# Patient Record
Sex: Female | Born: 1945
Health system: Southern US, Community
[De-identification: ages and names within clinical notes are randomized; demographics above are authoritative.]

## PROBLEM LIST (undated history)

## (undated) DIAGNOSIS — E2839 Other primary ovarian failure: Secondary | ICD-10-CM

## (undated) DIAGNOSIS — H539 Unspecified visual disturbance: Secondary | ICD-10-CM

## (undated) DIAGNOSIS — E039 Hypothyroidism, unspecified: Secondary | ICD-10-CM

## (undated) DIAGNOSIS — R569 Unspecified convulsions: Secondary | ICD-10-CM

## (undated) DIAGNOSIS — G40909 Epilepsy, unspecified, not intractable, without status epilepticus: Secondary | ICD-10-CM

## (undated) DIAGNOSIS — I10 Essential (primary) hypertension: Secondary | ICD-10-CM

## (undated) HISTORY — PX: TOE SURGERY: SHX1073

## (undated) HISTORY — PX: THYROID SURGERY: SHX805

## (undated) HISTORY — DX: Unspecified convulsions: R56.9

## (undated) HISTORY — DX: Epilepsy, unspecified, not intractable, without status epilepticus: G40.909

## (undated) HISTORY — DX: Unspecified visual disturbance: H53.9

## (undated) HISTORY — PX: ABDOMINAL HYSTERECTOMY: SHX81

## (undated) HISTORY — PX: TONSILLECTOMY: SUR1361

## (undated) HISTORY — PX: CYSTECTOMY: SUR359

---

## 1968-01-18 HISTORY — PX: BREAST CYST EXCISION: SHX579

## 2011-07-14 ENCOUNTER — Other Ambulatory Visit (HOSPITAL_COMMUNITY): Payer: Self-pay | Admitting: Family Medicine

## 2011-07-14 ENCOUNTER — Other Ambulatory Visit (INDEPENDENT_AMBULATORY_CARE_PROVIDER_SITE_OTHER): Payer: Self-pay | Admitting: General Surgery

## 2011-07-14 DIAGNOSIS — E010 Iodine-deficiency related diffuse (endemic) goiter: Secondary | ICD-10-CM

## 2011-07-19 ENCOUNTER — Ambulatory Visit (HOSPITAL_COMMUNITY)
Admission: RE | Admit: 2011-07-19 | Discharge: 2011-07-19 | Disposition: A | Discharge status: Home / Self Care | Payer: Self-pay | Source: Ambulatory Visit | Attending: Family Medicine | Admitting: Family Medicine

## 2011-07-19 DIAGNOSIS — E049 Nontoxic goiter, unspecified: Secondary | ICD-10-CM | POA: Insufficient documentation

## 2011-07-19 DIAGNOSIS — E010 Iodine-deficiency related diffuse (endemic) goiter: Secondary | ICD-10-CM

## 2011-10-03 ENCOUNTER — Emergency Department (HOSPITAL_COMMUNITY): Payer: Self-pay

## 2011-10-03 ENCOUNTER — Emergency Department (HOSPITAL_COMMUNITY)
Admission: EM | Admit: 2011-10-03 | Discharge: 2011-10-03 | Disposition: A | Discharge status: Home / Self Care | Payer: Self-pay | Attending: Emergency Medicine | Admitting: Emergency Medicine

## 2011-10-03 ENCOUNTER — Encounter (HOSPITAL_COMMUNITY): Payer: Self-pay

## 2011-10-03 DIAGNOSIS — Z7982 Long term (current) use of aspirin: Secondary | ICD-10-CM | POA: Insufficient documentation

## 2011-10-03 DIAGNOSIS — Z79899 Other long term (current) drug therapy: Secondary | ICD-10-CM | POA: Insufficient documentation

## 2011-10-03 DIAGNOSIS — E876 Hypokalemia: Secondary | ICD-10-CM | POA: Insufficient documentation

## 2011-10-03 DIAGNOSIS — Z87891 Personal history of nicotine dependence: Secondary | ICD-10-CM | POA: Insufficient documentation

## 2011-10-03 DIAGNOSIS — R569 Unspecified convulsions: Secondary | ICD-10-CM | POA: Insufficient documentation

## 2011-10-03 DIAGNOSIS — I10 Essential (primary) hypertension: Secondary | ICD-10-CM | POA: Insufficient documentation

## 2011-10-03 DIAGNOSIS — Z823 Family history of stroke: Secondary | ICD-10-CM | POA: Insufficient documentation

## 2011-10-03 HISTORY — DX: Hypothyroidism, unspecified: E03.9

## 2011-10-03 HISTORY — DX: Essential (primary) hypertension: I10

## 2011-10-03 HISTORY — DX: Other primary ovarian failure: E28.39

## 2011-10-03 LAB — CBC WITH DIFFERENTIAL/PLATELET
Basophils Absolute: 0 10*3/uL (ref 0.0–0.1)
Basophils Relative: 0 % (ref 0–1)
Eosinophils Relative: 2 % (ref 0–5)
Lymphocytes Relative: 41 % (ref 12–46)
MCHC: 34.7 g/dL (ref 30.0–36.0)
MCV: 79.1 fL (ref 78.0–100.0)
Platelets: 293 10*3/uL (ref 150–400)
RDW: 14.5 % (ref 11.5–15.5)
WBC: 11.4 10*3/uL — ABNORMAL HIGH (ref 4.0–10.5)

## 2011-10-03 LAB — COMPREHENSIVE METABOLIC PANEL
ALT: 9 U/L (ref 0–35)
AST: 17 U/L (ref 0–37)
Albumin: 4.1 g/dL (ref 3.5–5.2)
CO2: 27 mEq/L (ref 19–32)
Calcium: 9.8 mg/dL (ref 8.4–10.5)
Creatinine, Ser: 0.86 mg/dL (ref 0.50–1.10)
GFR calc non Af Amer: 70 mL/min — ABNORMAL LOW (ref >90)
Sodium: 139 mEq/L (ref 135–145)
Total Protein: 7.7 g/dL (ref 6.0–8.3)

## 2011-10-03 LAB — TROPONIN I: Troponin I: <0.3 ng/mL (ref <0.30)

## 2011-10-03 LAB — PROTIME-INR
INR: 1.01 (ref 0.00–1.49)
Prothrombin Time: 13.5 seconds (ref 11.6–15.2)

## 2011-10-03 MED ORDER — POTASSIUM CHLORIDE CRYS ER 20 MEQ PO TBCR: 20.0000 meq | EXTENDED_RELEASE_TABLET | Freq: Two times a day (BID) | ORAL | Status: DC

## 2011-10-03 NOTE — ED Notes (Signed)
The patient was found by her sister having what was described as a probable seizure.  The patient denies a history of seizures, however she was postictal when EMS arrived, and she bit her tongue.  The patient present incontinent of urine.  She denies any head, neck, or back pain.

## 2011-10-03 NOTE — ED Notes (Signed)
Patient transported to CT 

## 2011-10-03 NOTE — ED Provider Notes (Signed)
History     CSN: [id number]  Arrival date & time 10/03/11  0230   First MD Initiated Contact with Patient 10/03/11 0251      Chief Complaint  Patient presents with  . Seizures    not witnessed; no prior history    (Consider location/radiation/quality/duration/timing/severity/associated sxs/prior treatment) HPI Comments: 66 y/o with hx of change in MS this evening.  The patient states that the last thing she remembers she went to sleep at approximately 10:00 PM, the next thing she remembers she was waking up on the floor looking up at a much of people standing around her period her friend had called the paramedics because the patient was found face down with blood on the floor in urine on the floor. This was beside the bed. There was no witnessed seizure activity but the patient was confused significantly on the paramedics arrival.  By the time the patient was in the ambulance her mental status was back to baseline. At this time the patient is able to me that she has no other complaints other than a mild intermittent headache over the last several months and the occasional spell of lightheadedness. There has been no weakness numbness ataxia blurred vision chest pain cough shortness of breath swelling rashes diarrhea dysuria or any other complaints.  Patient is a 66 y.o. female presenting with seizures. The history is provided by the patient and a friend.  Seizures     Past Medical History  Diagnosis Date  . Hypothyroid   . Hypertension   . Estrogen deficiency     Past Surgical History  Procedure Date  . Abdominal hysterectomy   . Tonsillectomy   . Thyroid surgery   . Cystectomy     bilateral breast  . Toe surgery     Family History  Problem Relation Age of Onset  . Osteoarthritis Mother   . Hypertension Mother   . Hypertension Father   . Stroke Father     History  Substance Use Topics  . Smoking status: Former Smoker -- 1.0 packs/day    Types: Cigarettes    Quit  date: 07/02/2004  . Smokeless tobacco: Not on file  . Alcohol Use: 0.0 oz/week    0 Glasses of wine per week    OB History    Grav Para Term Preterm Abortions TAB SAB Ect Mult Living   2 1 1  1            Review of Systems  Neurological: Positive for seizures.  All other systems reviewed and are negative.    Allergies  Review of patient's allergies indicates no known allergies.  Home Medications   Current Outpatient Rx  Name Route Sig Dispense Refill  . ASPIRIN EC 81 MG PO TBEC Oral Take 81 mg by mouth daily.    Marland Kitchen PREMARIN PO Oral Take 1 tablet by mouth daily.    Marland Kitchen SYNTHROID PO Oral Take 1 tablet by mouth daily.    Marland Kitchen PRESCRIPTION MEDICATION Oral Take 1 tablet by mouth daily. Blood pressure medication    . POTASSIUM CHLORIDE CRYS ER 20 MEQ PO TBCR Oral Take 1 tablet (20 mEq total) by mouth 2 (two) times daily. 10 tablet 0    BP 151/71  Pulse 106  Temp 98.6 F (37 C) (Oral)  Resp 18  Ht 5\' 4"  (1.626 m)  Wt 151 lb (68.493 kg)  BMI 25.92 kg/m2  SpO2 99%  Physical Exam  Nursing note and vitals reviewed. Constitutional: She appears well-developed  and well-nourished. No distress.  HENT:  Head: Normocephalic and atraumatic.  Mouth/Throat: Oropharynx is clear and moist. No oropharyngeal exudate.       Minimal evidence of tongue biting at the tip of the tongue.  Eyes: Conjunctivae normal and EOM are normal. Pupils are equal, round, and reactive to light. Right eye exhibits no discharge. Left eye exhibits no discharge. No scleral icterus.  Neck: Normal range of motion. Neck supple. No JVD present. No thyromegaly present.  Cardiovascular: Normal rate, regular rhythm, normal heart sounds and intact distal pulses.  Exam reveals no gallop and no friction rub.   No murmur heard. Pulmonary/Chest: Effort normal and breath sounds normal. No respiratory distress. She has no wheezes. She has no rales.  Abdominal: Soft. Bowel sounds are normal. She exhibits no distension and no mass.  There is no tenderness.  Musculoskeletal: Normal range of motion. She exhibits no edema and no tenderness.  Lymphadenopathy:    She has no cervical adenopathy.  Neurological: She is alert. Coordination normal.       Neurologic exam:  Speech clear, pupils equal round reactive to light, extraocular movements intact  Normal peripheral visual fields Cranial nerves III through XII normal including no facial droop Follows commands, moves all extremities x4, normal strength to bilateral upper and lower extremities at all major muscle groups including grip Sensation normal to light touch and pinprick Coordination intact, no limb ataxia, finger-nose-finger normal Rapid alternating movements normal No pronator drift Gait normal   Skin: Skin is warm and dry. No rash noted. No erythema.  Psychiatric: She has a normal mood and affect. Her behavior is normal.    ED Course  Procedures (including critical care time)  Labs Reviewed  CBC WITH DIFFERENTIAL - Abnormal; Notable for the following:    WBC 11.4 (*)     Hemoglobin 11.0 (*)     HCT 31.7 (*)     Lymphs Abs 4.7 (*)     All other components within normal limits  COMPREHENSIVE METABOLIC PANEL - Abnormal; Notable for the following:    Potassium 3.4 (*)     Glucose, Bld 102 (*)     Total Bilirubin 0.2 (*)     GFR calc non Af Amer 70 (*)     GFR calc Af Amer 81 (*)     All other components within normal limits  APTT  PROTIME-INR  TROPONIN I   Ct Head Wo Contrast  10/03/2011  *RADIOLOGY REPORT*  Clinical Data: Possible seizure.  CT HEAD WITHOUT CONTRAST  Technique:  Contiguous axial images were obtained from the base of the skull through the vertex without contrast.  Comparison: None.  Findings: No evidence of acute intracranial abnormality including infarction, hemorrhage, mass lesion, mass effect, midline shift or abnormal extra-axial fluid collection is identified.  Mild appearing chronic microvascular ischemic change is noted.  Imaged  paranasal sinuses and mastoid air cells are clear.  IMPRESSION: No acute finding.   Original Report Authenticated By: Bernadene Bell. D'ALESSIO, M.D.      1. Seizure   2. Hypokalemia       MDM  At this time the patient has a normal mental status and normal neurologic exam, she does have evidence of seizure-like activity such as the urine incontinence on the floor at her home as well as tongue biting. Her postictal phase is also consistent with this. Her EKG shows nonspecific ST abnormalities, she has no old EKG with which to compare. She has no history of heart disease,  she is not a drinker and does not drink alcohol but occasionally. She has not started any new medications area CT scan pending, patient stable and without complaint at this time  ED ECG REPORT  I personally interpreted this EKG   Date: 10/03/2011   Rate: 104  Rhythm: sinus tachycardia  QRS Axis: normal  Intervals: normal  ST/T Wave abnormalities: nonspecific ST/T changes  Conduction Disutrbances:none  Narrative Interpretation:   Old EKG Reviewed: No old EKG with which to compare, diffuse ST segment abnormalities   The patient has had no further seizures, her vital signs have normalized, her CT scan has been reviewed and shows no signs of acute abnormalities, slight hypokalemia but no other significant findings. The patient appears stable for discharge, she has been encouraged to followup with neurology for EEG, I've given her precautions for seizures including no driving swimming or operating heavy machinery. She is expressed her understanding. At this time given this is a first-time seizure we'll not start any new medications for her       Vida Roller, MD 10/03/11 [phone]

## 2011-10-03 NOTE — ED Notes (Signed)
The patient is AOx4 and comfortable with her discharge instructions. 

## 2011-11-24 ENCOUNTER — Emergency Department (HOSPITAL_COMMUNITY)
Admission: EM | Admit: 2011-11-24 | Discharge: 2011-11-24 | Disposition: A | Discharge status: Home / Self Care | Payer: Self-pay | Attending: Emergency Medicine | Admitting: Emergency Medicine

## 2011-11-24 ENCOUNTER — Encounter (HOSPITAL_COMMUNITY): Payer: Self-pay | Admitting: Emergency Medicine

## 2011-11-24 DIAGNOSIS — Z7982 Long term (current) use of aspirin: Secondary | ICD-10-CM | POA: Insufficient documentation

## 2011-11-24 DIAGNOSIS — I10 Essential (primary) hypertension: Secondary | ICD-10-CM | POA: Insufficient documentation

## 2011-11-24 DIAGNOSIS — Z79899 Other long term (current) drug therapy: Secondary | ICD-10-CM | POA: Insufficient documentation

## 2011-11-24 DIAGNOSIS — R569 Unspecified convulsions: Secondary | ICD-10-CM | POA: Insufficient documentation

## 2011-11-24 DIAGNOSIS — E039 Hypothyroidism, unspecified: Secondary | ICD-10-CM | POA: Insufficient documentation

## 2011-11-24 DIAGNOSIS — Z87891 Personal history of nicotine dependence: Secondary | ICD-10-CM | POA: Insufficient documentation

## 2011-11-24 LAB — GLUCOSE, CAPILLARY: Glucose-Capillary: 103 mg/dL — ABNORMAL HIGH (ref 70–99)

## 2011-11-24 MED ORDER — LEVETIRACETAM 500 MG PO TABS: 500.0000 mg | ORAL_TABLET | Freq: Two times a day (BID) | ORAL | Status: DC

## 2011-11-24 MED ORDER — SODIUM CHLORIDE 0.9 % IV SOLN
500.0000 mg | Freq: Once | INTRAVENOUS | Status: AC
  Administered 2011-11-24: 500 mg via INTRAVENOUS
  Filled 2011-11-24: qty 5

## 2011-11-24 MED ORDER — LORAZEPAM 2 MG/ML IJ SOLN
1.0000 mg | Freq: Once | INTRAMUSCULAR | Status: AC
  Administered 2011-11-24: 1 mg via INTRAVENOUS
  Filled 2011-11-24: qty 1

## 2011-11-24 NOTE — ED Notes (Signed)
Pt transported from home by EMS after being found by family with snoring respirations. Pt does have tongue trauma and incontinence. A & O on arrival to ED. Unable to est IV. Pt has had 1 seizure in September of this year. No medications for seizures.

## 2011-11-24 NOTE — ED Provider Notes (Signed)
History     CSN: [id number]  Arrival date & time 11/24/11  0145   First MD Initiated Contact with Patient 11/24/11 0150      Chief Complaint  Patient presents with  . Seizures    (Consider location/radiation/quality/duration/timing/severity/associated sxs/prior treatment) HPI Comments: 66 year old female with a history of first time seizure in September of this year who presents with a complaint of recurrent seizure. The patient states that about a week and a half ago she felt like she may have had another seizure after she bit her tongue in her sleep and then again this evening she had another likely seizure after she was found by family to have snoring respirations and was difficult to arouse. She had incontinence as well. When paramedics found her she was postictal, she has had a rapid return to her baseline mental status. She denies feeling bad at all this evening, she has no complaints, at this time she has no complaints other than some mild discomfort in her tongue which is no different than it was before her seizure this evening. She denies chest pain, shortness of breath, blurred vision, numbness, weakness, ataxia, confusion, memory loss. She does number having seizures this evening. She was seen by neurology after my initial evaluation of her in September and had an EEG which she reports was normal. She was not started on any medications.  Patient is a 66 y.o. female presenting with seizures. The history is provided by the patient.  Seizures     Past Medical History  Diagnosis Date  . Hypothyroid   . Hypertension   . Estrogen deficiency     Past Surgical History  Procedure Date  . Abdominal hysterectomy   . Tonsillectomy   . Thyroid surgery   . Cystectomy     bilateral breast  . Toe surgery     Family History  Problem Relation Age of Onset  . Osteoarthritis Mother   . Hypertension Mother   . Hypertension Father   . Stroke Father     History  Substance Use  Topics  . Smoking status: Former Smoker -- 1.0 packs/day    Types: Cigarettes    Quit date: 07/02/2004  . Smokeless tobacco: Not on file  . Alcohol Use: 0.0 oz/week    0 Glasses of wine per week    OB History    Grav Para Term Preterm Abortions TAB SAB Ect Mult Living   2 1 1  1            Review of Systems  Neurological: Positive for seizures.  All other systems reviewed and are negative.    Allergies  Review of patient's allergies indicates no known allergies.  Home Medications   Current Outpatient Rx  Name  Route  Sig  Dispense  Refill  . ASPIRIN EC 81 MG PO TBEC   Oral   Take 81 mg by mouth daily.         Marland Kitchen ESTRADIOL 0.5 MG PO TABS   Oral   Take 0.5 mg by mouth daily.         Marland Kitchen LEVOTHYROXINE SODIUM 50 MCG PO TABS   Oral   Take 50 mcg by mouth daily.         Marland Kitchen LOSARTAN POTASSIUM-HCTZ 50-12.5 MG PO TABS   Oral   Take 1 tablet by mouth daily.         Marland Kitchen LEVETIRACETAM 500 MG PO TABS   Oral   Take 1 tablet (500 mg total)  by mouth every 12 (twelve) hours.   60 tablet   3     BP 124/72  Pulse 105  Temp 98.4 F (36.9 C) (Oral)  Resp 20  SpO2 100%  Physical Exam  Nursing note and vitals reviewed. Constitutional: She appears well-developed and well-nourished. No distress.  HENT:  Head: Normocephalic and atraumatic.  Mouth/Throat: Oropharynx is clear and moist. No oropharyngeal exudate.       Small bite mark to the tongue, no bleeding  Eyes: Conjunctivae normal and EOM are normal. Pupils are equal, round, and reactive to light. Right eye exhibits no discharge. Left eye exhibits no discharge. No scleral icterus.  Neck: Normal range of motion. Neck supple. No JVD present. No thyromegaly present.  Cardiovascular: Normal rate, regular rhythm, normal heart sounds and intact distal pulses.  Exam reveals no gallop and no friction rub.   No murmur heard. Pulmonary/Chest: Effort normal and breath sounds normal. No respiratory distress. She has no wheezes.  She has no rales.  Abdominal: Soft. Bowel sounds are normal. She exhibits no distension and no mass. There is no tenderness.  Musculoskeletal: Normal range of motion. She exhibits no edema and no tenderness.  Lymphadenopathy:    She has no cervical adenopathy.  Neurological: She is alert. Coordination normal.       Speech is clear, vision is at baseline, cranial nerves III through XII are intact, strength is 5 out of 5 in all 4 extremities, sensation to light touch equal bilateral lower extremities, no limb ataxia  Skin: Skin is warm and dry. No rash noted. No erythema.  Psychiatric: She has a normal mood and affect. Her behavior is normal.    ED Course  Procedures (including critical care time)  Labs Reviewed  GLUCOSE, CAPILLARY - Abnormal; Notable for the following:    Glucose-Capillary 103 (*)     All other components within normal limits   No results found.   1. Seizure       MDM  The patient has normal mental status and neurologic exam at this time, vital signs significant only for a slight tachycardia, we'll give IV fluids, start anti-seizure medications and discuss with neurology prior to discharge. She has had a very thorough evaluation prior to this with blood work, CT scan and an EEG. Her blood sugar is 105 this evening I do not see any other indications for further workup this evening.   ED ECG REPORT  I personally interpreted this EKG   Date: 11/24/2011   Rate: 105  Rhythm: sinus tachycardia  QRS Axis: normal  Intervals: normal  ST/T Wave abnormalities: nonspecific ST/T changes  Conduction Disutrbances:none  Narrative Interpretation:   Old EKG Reviewed: unchanged  I discussed the patient's care with Dr. Roseanne Reno, he agrees with starting antiseizure medications, Keppra 500 mg before she leaves and 500 mg twice a day at home. He also agrees that no further workup this evening. Outpatient followup appropriate. Discussed with patient who is in  agreement.    Discharge Prescriptions include: Keppra 500 mg by mouth twice a day   Vida Roller, MD 11/24/11 [phone]

## 2011-11-24 NOTE — ED Notes (Signed)
[id number]<YQ> Expected date:11/24/11<BR> Expected time: 1:25 AM<BR> Means of arrival:Ambulance<BR> Comments:<BR> seizure

## 2011-11-24 NOTE — ED Notes (Signed)
Pt wrong signature on chart.  Will correct.

## 2012-04-06 DIAGNOSIS — R569 Unspecified convulsions: Secondary | ICD-10-CM | POA: Diagnosis not present

## 2012-04-11 DIAGNOSIS — G40802 Other epilepsy, not intractable, without status epilepticus: Secondary | ICD-10-CM | POA: Diagnosis not present

## 2012-04-16 DIAGNOSIS — R569 Unspecified convulsions: Secondary | ICD-10-CM | POA: Diagnosis not present

## 2012-04-25 DIAGNOSIS — R569 Unspecified convulsions: Secondary | ICD-10-CM | POA: Diagnosis not present

## 2013-02-07 DIAGNOSIS — E041 Nontoxic single thyroid nodule: Secondary | ICD-10-CM | POA: Diagnosis not present

## 2013-02-07 DIAGNOSIS — Z79899 Other long term (current) drug therapy: Secondary | ICD-10-CM | POA: Diagnosis not present

## 2013-02-07 DIAGNOSIS — E039 Hypothyroidism, unspecified: Secondary | ICD-10-CM | POA: Diagnosis not present

## 2013-02-07 DIAGNOSIS — D649 Anemia, unspecified: Secondary | ICD-10-CM | POA: Diagnosis not present

## 2013-02-07 DIAGNOSIS — Z8673 Personal history of transient ischemic attack (TIA), and cerebral infarction without residual deficits: Secondary | ICD-10-CM | POA: Diagnosis not present

## 2013-02-07 DIAGNOSIS — R569 Unspecified convulsions: Secondary | ICD-10-CM | POA: Diagnosis not present

## 2013-02-07 DIAGNOSIS — I10 Essential (primary) hypertension: Secondary | ICD-10-CM | POA: Diagnosis not present

## 2013-08-27 DIAGNOSIS — F411 Generalized anxiety disorder: Secondary | ICD-10-CM | POA: Diagnosis not present

## 2013-08-27 DIAGNOSIS — I10 Essential (primary) hypertension: Secondary | ICD-10-CM | POA: Diagnosis not present

## 2013-08-27 DIAGNOSIS — E039 Hypothyroidism, unspecified: Secondary | ICD-10-CM | POA: Diagnosis not present

## 2013-08-27 DIAGNOSIS — D649 Anemia, unspecified: Secondary | ICD-10-CM | POA: Diagnosis not present

## 2013-08-27 DIAGNOSIS — Z79899 Other long term (current) drug therapy: Secondary | ICD-10-CM | POA: Diagnosis not present

## 2013-11-18 ENCOUNTER — Encounter (HOSPITAL_COMMUNITY): Payer: Self-pay | Admitting: Emergency Medicine

## 2014-02-06 DIAGNOSIS — R21 Rash and other nonspecific skin eruption: Secondary | ICD-10-CM | POA: Diagnosis not present

## 2014-02-06 DIAGNOSIS — I10 Essential (primary) hypertension: Secondary | ICD-10-CM | POA: Diagnosis not present

## 2015-02-11 ENCOUNTER — Other Ambulatory Visit: Payer: Self-pay | Admitting: Family Medicine

## 2015-02-11 DIAGNOSIS — L723 Sebaceous cyst: Secondary | ICD-10-CM | POA: Diagnosis not present

## 2015-02-11 DIAGNOSIS — R002 Palpitations: Secondary | ICD-10-CM | POA: Diagnosis not present

## 2015-02-11 DIAGNOSIS — R634 Abnormal weight loss: Secondary | ICD-10-CM | POA: Diagnosis not present

## 2015-02-11 DIAGNOSIS — E049 Nontoxic goiter, unspecified: Secondary | ICD-10-CM

## 2015-02-11 DIAGNOSIS — I10 Essential (primary) hypertension: Secondary | ICD-10-CM | POA: Diagnosis not present

## 2015-02-11 DIAGNOSIS — Z1239 Encounter for other screening for malignant neoplasm of breast: Secondary | ICD-10-CM | POA: Diagnosis not present

## 2015-02-11 DIAGNOSIS — Z1211 Encounter for screening for malignant neoplasm of colon: Secondary | ICD-10-CM | POA: Diagnosis not present

## 2015-02-11 DIAGNOSIS — Z79899 Other long term (current) drug therapy: Secondary | ICD-10-CM | POA: Diagnosis not present

## 2015-02-11 DIAGNOSIS — Z8669 Personal history of other diseases of the nervous system and sense organs: Secondary | ICD-10-CM | POA: Diagnosis not present

## 2015-02-11 DIAGNOSIS — E039 Hypothyroidism, unspecified: Secondary | ICD-10-CM | POA: Diagnosis not present

## 2015-02-11 DIAGNOSIS — Z8673 Personal history of transient ischemic attack (TIA), and cerebral infarction without residual deficits: Secondary | ICD-10-CM | POA: Diagnosis not present

## 2015-02-11 DIAGNOSIS — E041 Nontoxic single thyroid nodule: Secondary | ICD-10-CM | POA: Diagnosis not present

## 2015-02-13 ENCOUNTER — Other Ambulatory Visit: Payer: Self-pay | Admitting: Family Medicine

## 2015-02-13 DIAGNOSIS — Z1231 Encounter for screening mammogram for malignant neoplasm of breast: Secondary | ICD-10-CM

## 2015-02-13 DIAGNOSIS — E2839 Other primary ovarian failure: Secondary | ICD-10-CM

## 2015-02-16 ENCOUNTER — Ambulatory Visit
Admission: RE | Admit: 2015-02-16 | Discharge: 2015-02-16 | Disposition: A | Discharge status: Home / Self Care | Payer: Medicare Other | Source: Ambulatory Visit | Attending: Family Medicine | Admitting: Family Medicine

## 2015-02-16 DIAGNOSIS — E049 Nontoxic goiter, unspecified: Secondary | ICD-10-CM

## 2015-02-16 DIAGNOSIS — E042 Nontoxic multinodular goiter: Secondary | ICD-10-CM | POA: Diagnosis not present

## 2015-02-18 ENCOUNTER — Other Ambulatory Visit: Payer: Self-pay | Admitting: Family Medicine

## 2015-02-18 DIAGNOSIS — E041 Nontoxic single thyroid nodule: Secondary | ICD-10-CM

## 2015-03-04 ENCOUNTER — Other Ambulatory Visit (HOSPITAL_COMMUNITY)
Admission: RE | Admit: 2015-03-04 | Discharge: 2015-03-04 | Disposition: A | Discharge status: Home / Self Care | Payer: Medicare Other | Source: Ambulatory Visit | Attending: Physician Assistant | Admitting: Physician Assistant

## 2015-03-04 ENCOUNTER — Ambulatory Visit
Admission: RE | Admit: 2015-03-04 | Discharge: 2015-03-04 | Disposition: A | Discharge status: Home / Self Care | Payer: Medicare Other | Source: Ambulatory Visit | Attending: Family Medicine | Admitting: Family Medicine

## 2015-03-04 DIAGNOSIS — E041 Nontoxic single thyroid nodule: Secondary | ICD-10-CM

## 2015-03-04 DIAGNOSIS — E042 Nontoxic multinodular goiter: Secondary | ICD-10-CM | POA: Insufficient documentation

## 2015-03-04 NOTE — Procedures (Signed)
Using direct ultrasound guidance, 3 passes were made using needles into each of the nodules within the right and left lobe of the thyroid.   Ultrasound was used to confirm needle placements on all occasions.   Specimens were sent to Pathology for analysis.   Natasha Burda S Amellia Panik PA-C

## 2015-03-11 ENCOUNTER — Other Ambulatory Visit: Payer: Self-pay | Admitting: Family Medicine

## 2015-03-11 DIAGNOSIS — E041 Nontoxic single thyroid nodule: Secondary | ICD-10-CM

## 2015-04-21 ENCOUNTER — Other Ambulatory Visit: Payer: Self-pay | Admitting: Gastroenterology

## 2015-04-21 DIAGNOSIS — Z1211 Encounter for screening for malignant neoplasm of colon: Secondary | ICD-10-CM | POA: Diagnosis not present

## 2015-04-21 DIAGNOSIS — D122 Benign neoplasm of ascending colon: Secondary | ICD-10-CM | POA: Diagnosis not present

## 2015-04-21 DIAGNOSIS — K640 First degree hemorrhoids: Secondary | ICD-10-CM | POA: Diagnosis not present

## 2015-04-21 DIAGNOSIS — D126 Benign neoplasm of colon, unspecified: Secondary | ICD-10-CM | POA: Diagnosis not present

## 2015-10-22 DIAGNOSIS — E039 Hypothyroidism, unspecified: Secondary | ICD-10-CM | POA: Diagnosis not present

## 2015-10-22 DIAGNOSIS — I10 Essential (primary) hypertension: Secondary | ICD-10-CM | POA: Diagnosis not present

## 2015-10-22 DIAGNOSIS — Z79899 Other long term (current) drug therapy: Secondary | ICD-10-CM | POA: Diagnosis not present

## 2015-10-22 DIAGNOSIS — R634 Abnormal weight loss: Secondary | ICD-10-CM | POA: Diagnosis not present

## 2015-10-22 DIAGNOSIS — Z8669 Personal history of other diseases of the nervous system and sense organs: Secondary | ICD-10-CM | POA: Diagnosis not present

## 2015-10-23 DIAGNOSIS — E039 Hypothyroidism, unspecified: Secondary | ICD-10-CM | POA: Diagnosis not present

## 2015-10-23 DIAGNOSIS — R634 Abnormal weight loss: Secondary | ICD-10-CM | POA: Diagnosis not present

## 2016-03-16 DIAGNOSIS — F4321 Adjustment disorder with depressed mood: Secondary | ICD-10-CM | POA: Diagnosis not present

## 2016-03-16 DIAGNOSIS — E049 Nontoxic goiter, unspecified: Secondary | ICD-10-CM | POA: Diagnosis not present

## 2016-03-16 DIAGNOSIS — E039 Hypothyroidism, unspecified: Secondary | ICD-10-CM | POA: Diagnosis not present

## 2016-03-16 DIAGNOSIS — R634 Abnormal weight loss: Secondary | ICD-10-CM | POA: Diagnosis not present

## 2016-03-23 ENCOUNTER — Other Ambulatory Visit: Payer: Self-pay | Admitting: Family

## 2016-03-23 DIAGNOSIS — Z1231 Encounter for screening mammogram for malignant neoplasm of breast: Secondary | ICD-10-CM

## 2016-04-11 DIAGNOSIS — E042 Nontoxic multinodular goiter: Secondary | ICD-10-CM | POA: Diagnosis not present

## 2016-04-25 ENCOUNTER — Ambulatory Visit
Admission: RE | Admit: 2016-04-25 | Discharge: 2016-04-25 | Disposition: A | Discharge status: Home / Self Care | Payer: Medicare Other | Source: Ambulatory Visit | Attending: Family | Admitting: Family

## 2016-04-25 DIAGNOSIS — Z1231 Encounter for screening mammogram for malignant neoplasm of breast: Secondary | ICD-10-CM | POA: Diagnosis not present

## 2016-04-26 ENCOUNTER — Other Ambulatory Visit: Payer: Self-pay | Admitting: Family

## 2016-04-26 DIAGNOSIS — R928 Other abnormal and inconclusive findings on diagnostic imaging of breast: Secondary | ICD-10-CM

## 2016-04-29 ENCOUNTER — Ambulatory Visit
Admission: RE | Admit: 2016-04-29 | Discharge: 2016-04-29 | Disposition: A | Discharge status: Home / Self Care | Payer: Medicare Other | Source: Ambulatory Visit | Attending: Family | Admitting: Family

## 2016-04-29 ENCOUNTER — Other Ambulatory Visit: Payer: Self-pay | Admitting: Family

## 2016-04-29 DIAGNOSIS — N6320 Unspecified lump in the left breast, unspecified quadrant: Secondary | ICD-10-CM

## 2016-04-29 DIAGNOSIS — N6322 Unspecified lump in the left breast, upper inner quadrant: Secondary | ICD-10-CM | POA: Diagnosis not present

## 2016-04-29 DIAGNOSIS — R928 Other abnormal and inconclusive findings on diagnostic imaging of breast: Secondary | ICD-10-CM

## 2016-05-09 ENCOUNTER — Ambulatory Visit
Admission: RE | Admit: 2016-05-09 | Discharge: 2016-05-09 | Disposition: A | Discharge status: Home / Self Care | Payer: Medicare Other | Source: Ambulatory Visit | Attending: Family | Admitting: Family

## 2016-05-09 ENCOUNTER — Other Ambulatory Visit: Payer: Self-pay | Admitting: Family

## 2016-05-09 DIAGNOSIS — N6320 Unspecified lump in the left breast, unspecified quadrant: Secondary | ICD-10-CM

## 2016-05-09 DIAGNOSIS — R928 Other abnormal and inconclusive findings on diagnostic imaging of breast: Secondary | ICD-10-CM

## 2016-05-09 DIAGNOSIS — N6322 Unspecified lump in the left breast, upper inner quadrant: Secondary | ICD-10-CM | POA: Diagnosis not present

## 2016-05-09 DIAGNOSIS — D242 Benign neoplasm of left breast: Secondary | ICD-10-CM | POA: Diagnosis not present

## 2016-06-06 ENCOUNTER — Emergency Department (HOSPITAL_COMMUNITY): Payer: Medicare Other

## 2016-06-06 ENCOUNTER — Encounter (HOSPITAL_COMMUNITY): Payer: Self-pay | Admitting: Emergency Medicine

## 2016-06-06 ENCOUNTER — Emergency Department (HOSPITAL_COMMUNITY)
Admission: EM | Admit: 2016-06-06 | Discharge: 2016-06-07 | Disposition: A | Discharge status: Home / Self Care | Payer: Medicare Other | Attending: Emergency Medicine | Admitting: Emergency Medicine

## 2016-06-06 DIAGNOSIS — Z79899 Other long term (current) drug therapy: Secondary | ICD-10-CM | POA: Diagnosis not present

## 2016-06-06 DIAGNOSIS — E039 Hypothyroidism, unspecified: Secondary | ICD-10-CM | POA: Diagnosis not present

## 2016-06-06 DIAGNOSIS — I10 Essential (primary) hypertension: Secondary | ICD-10-CM | POA: Insufficient documentation

## 2016-06-06 DIAGNOSIS — S199XXA Unspecified injury of neck, initial encounter: Secondary | ICD-10-CM | POA: Diagnosis not present

## 2016-06-06 DIAGNOSIS — Z87891 Personal history of nicotine dependence: Secondary | ICD-10-CM | POA: Insufficient documentation

## 2016-06-06 DIAGNOSIS — R569 Unspecified convulsions: Secondary | ICD-10-CM | POA: Diagnosis not present

## 2016-06-06 DIAGNOSIS — Z7982 Long term (current) use of aspirin: Secondary | ICD-10-CM | POA: Insufficient documentation

## 2016-06-06 DIAGNOSIS — S0990XA Unspecified injury of head, initial encounter: Secondary | ICD-10-CM | POA: Diagnosis not present

## 2016-06-06 DIAGNOSIS — G40909 Epilepsy, unspecified, not intractable, without status epilepticus: Secondary | ICD-10-CM | POA: Insufficient documentation

## 2016-06-06 MED ORDER — SODIUM CHLORIDE 0.9 % IV SOLN: INTRAVENOUS | Status: DC

## 2016-06-06 MED ORDER — SODIUM CHLORIDE 0.9 % IV SOLN
1000.0000 mg | Freq: Once | INTRAVENOUS | Status: AC
  Administered 2016-06-07: 1000 mg via INTRAVENOUS
  Filled 2016-06-06: qty 10
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
## 2016-06-07 LAB — CBC WITH DIFFERENTIAL/PLATELET
Basophils Absolute: 0 10*3/uL (ref 0.0–0.1)
Basophils Relative: 0 %
EOS ABS: 0 10*3/uL (ref 0.0–0.7)
EOS PCT: 0 %
HCT: 28.6 % — ABNORMAL LOW (ref 36.0–46.0)
HEMOGLOBIN: 9.8 g/dL — AB (ref 12.0–15.0)
LYMPHS ABS: 1.8 10*3/uL (ref 0.7–4.0)
Lymphocytes Relative: 23 %
MCH: 27.8 pg (ref 26.0–34.0)
MCHC: 34.3 g/dL (ref 30.0–36.0)
MCV: 81.3 fL (ref 78.0–100.0)
MONOS PCT: 7 %
Monocytes Absolute: 0.5 10*3/uL (ref 0.1–1.0)
Neutro Abs: 5.5 10*3/uL (ref 1.7–7.7)
Neutrophils Relative %: 70 %
PLATELETS: 281 10*3/uL (ref 150–400)
RBC: 3.52 MIL/uL — ABNORMAL LOW (ref 3.87–5.11)
RDW: 14.5 % (ref 11.5–15.5)
WBC: 7.8 10*3/uL (ref 4.0–10.5)

## 2016-06-07 LAB — I-STAT CHEM 8, ED
BUN: 5 mg/dL — ABNORMAL LOW (ref 6–20)
CALCIUM ION: 1.19 mmol/L (ref 1.15–1.40)
Chloride: 102 mmol/L (ref 101–111)
Creatinine, Ser: 0.8 mg/dL (ref 0.44–1.00)
GLUCOSE: 93 mg/dL (ref 65–99)
HCT: 25 % — ABNORMAL LOW (ref 36.0–46.0)
Hemoglobin: 8.5 g/dL — ABNORMAL LOW (ref 12.0–15.0)
Potassium: 3.1 mmol/L — ABNORMAL LOW (ref 3.5–5.1)
SODIUM: 140 mmol/L (ref 135–145)
TCO2: 28 mmol/L (ref 0–100)

## 2016-06-07 LAB — URINALYSIS, ROUTINE W REFLEX MICROSCOPIC
Bilirubin Urine: NEGATIVE
Glucose, UA: NEGATIVE mg/dL
Ketones, ur: NEGATIVE mg/dL
NITRITE: NEGATIVE
Protein, ur: NEGATIVE mg/dL
SPECIFIC GRAVITY, URINE: 1.008 (ref 1.005–1.030)
pH: 6 (ref 5.0–8.0)

## 2016-06-07 NOTE — Discharge Instructions (Signed)
NO DRIVING FOR 6 months or until cleared by neurology

## 2016-06-07 NOTE — ED Notes (Signed)
Patient is alert and oriented x3.  She was given DC instructions and follow up visit instructions.  Patient gave verbal understanding. She was DC ambulatory under her own power to home.  V/S stable.  He was not showing any signs of distress on DC 

## 2016-11-24 DIAGNOSIS — I10 Essential (primary) hypertension: Secondary | ICD-10-CM | POA: Diagnosis not present

## 2016-11-24 DIAGNOSIS — F4321 Adjustment disorder with depressed mood: Secondary | ICD-10-CM | POA: Diagnosis not present

## 2016-11-24 DIAGNOSIS — Z681 Body mass index (BMI) 19 or less, adult: Secondary | ICD-10-CM | POA: Diagnosis not present

## 2016-11-24 DIAGNOSIS — E049 Nontoxic goiter, unspecified: Secondary | ICD-10-CM | POA: Diagnosis not present

## 2016-11-24 DIAGNOSIS — R634 Abnormal weight loss: Secondary | ICD-10-CM | POA: Diagnosis not present

## 2016-11-24 DIAGNOSIS — D170 Benign lipomatous neoplasm of skin and subcutaneous tissue of head, face and neck: Secondary | ICD-10-CM | POA: Diagnosis not present

## 2016-11-24 DIAGNOSIS — E039 Hypothyroidism, unspecified: Secondary | ICD-10-CM | POA: Diagnosis not present

## 2016-11-28 ENCOUNTER — Other Ambulatory Visit: Payer: Self-pay | Admitting: Family Medicine

## 2016-11-28 DIAGNOSIS — E049 Nontoxic goiter, unspecified: Secondary | ICD-10-CM

## 2017-05-18 DIAGNOSIS — E039 Hypothyroidism, unspecified: Secondary | ICD-10-CM | POA: Diagnosis not present

## 2017-05-18 DIAGNOSIS — Z0000 Encounter for general adult medical examination without abnormal findings: Secondary | ICD-10-CM | POA: Diagnosis not present

## 2017-05-18 DIAGNOSIS — E041 Nontoxic single thyroid nodule: Secondary | ICD-10-CM | POA: Diagnosis not present

## 2017-05-18 DIAGNOSIS — E7800 Pure hypercholesterolemia, unspecified: Secondary | ICD-10-CM | POA: Diagnosis not present

## 2017-05-18 DIAGNOSIS — I10 Essential (primary) hypertension: Secondary | ICD-10-CM | POA: Diagnosis not present

## 2017-05-18 DIAGNOSIS — D170 Benign lipomatous neoplasm of skin and subcutaneous tissue of head, face and neck: Secondary | ICD-10-CM | POA: Diagnosis not present

## 2017-05-18 DIAGNOSIS — Z1159 Encounter for screening for other viral diseases: Secondary | ICD-10-CM | POA: Diagnosis not present

## 2017-05-18 DIAGNOSIS — D649 Anemia, unspecified: Secondary | ICD-10-CM | POA: Diagnosis not present

## 2017-05-18 DIAGNOSIS — R5383 Other fatigue: Secondary | ICD-10-CM | POA: Diagnosis not present

## 2017-05-18 DIAGNOSIS — R809 Proteinuria, unspecified: Secondary | ICD-10-CM | POA: Diagnosis not present

## 2017-05-18 DIAGNOSIS — F4321 Adjustment disorder with depressed mood: Secondary | ICD-10-CM | POA: Diagnosis not present

## 2017-06-01 DIAGNOSIS — E876 Hypokalemia: Secondary | ICD-10-CM | POA: Diagnosis not present

## 2017-06-07 DIAGNOSIS — L720 Epidermal cyst: Secondary | ICD-10-CM | POA: Diagnosis not present

## 2017-06-07 DIAGNOSIS — L708 Other acne: Secondary | ICD-10-CM | POA: Diagnosis not present

## 2017-07-24 DIAGNOSIS — E039 Hypothyroidism, unspecified: Secondary | ICD-10-CM | POA: Diagnosis not present

## 2017-10-16 DIAGNOSIS — E039 Hypothyroidism, unspecified: Secondary | ICD-10-CM | POA: Diagnosis not present

## 2017-10-16 DIAGNOSIS — I10 Essential (primary) hypertension: Secondary | ICD-10-CM | POA: Diagnosis not present

## 2017-10-16 DIAGNOSIS — E440 Moderate protein-calorie malnutrition: Secondary | ICD-10-CM | POA: Diagnosis not present

## 2017-10-16 DIAGNOSIS — I499 Cardiac arrhythmia, unspecified: Secondary | ICD-10-CM | POA: Diagnosis not present

## 2017-10-26 ENCOUNTER — Other Ambulatory Visit: Payer: Self-pay | Admitting: Family Medicine

## 2017-10-26 ENCOUNTER — Ambulatory Visit (INDEPENDENT_AMBULATORY_CARE_PROVIDER_SITE_OTHER): Payer: Medicare Other

## 2017-10-26 DIAGNOSIS — I499 Cardiac arrhythmia, unspecified: Secondary | ICD-10-CM

## 2017-10-26 DIAGNOSIS — R002 Palpitations: Secondary | ICD-10-CM | POA: Diagnosis not present

## 2017-11-22 DIAGNOSIS — Z87898 Personal history of other specified conditions: Secondary | ICD-10-CM | POA: Diagnosis not present

## 2017-11-22 DIAGNOSIS — E440 Moderate protein-calorie malnutrition: Secondary | ICD-10-CM | POA: Diagnosis not present

## 2017-11-22 DIAGNOSIS — F320 Major depressive disorder, single episode, mild: Secondary | ICD-10-CM | POA: Diagnosis not present

## 2017-11-22 DIAGNOSIS — N182 Chronic kidney disease, stage 2 (mild): Secondary | ICD-10-CM | POA: Diagnosis not present

## 2017-11-22 DIAGNOSIS — R809 Proteinuria, unspecified: Secondary | ICD-10-CM | POA: Diagnosis not present

## 2017-11-22 DIAGNOSIS — I10 Essential (primary) hypertension: Secondary | ICD-10-CM | POA: Diagnosis not present

## 2017-11-22 DIAGNOSIS — E039 Hypothyroidism, unspecified: Secondary | ICD-10-CM | POA: Diagnosis not present

## 2017-11-29 ENCOUNTER — Other Ambulatory Visit: Payer: Self-pay

## 2017-11-29 ENCOUNTER — Encounter: Payer: Self-pay | Admitting: Neurology

## 2017-11-29 ENCOUNTER — Ambulatory Visit (INDEPENDENT_AMBULATORY_CARE_PROVIDER_SITE_OTHER): Payer: Medicare Other | Admitting: Neurology

## 2017-11-29 DIAGNOSIS — G40909 Epilepsy, unspecified, not intractable, without status epilepticus: Secondary | ICD-10-CM | POA: Diagnosis not present

## 2017-11-29 HISTORY — DX: Epilepsy, unspecified, not intractable, without status epilepticus: G40.909
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
## 2017-12-27 ENCOUNTER — Telehealth: Payer: Self-pay | Admitting: Neurology

## 2017-12-27 ENCOUNTER — Ambulatory Visit (INDEPENDENT_AMBULATORY_CARE_PROVIDER_SITE_OTHER): Payer: Medicare Other | Admitting: Neurology

## 2017-12-27 DIAGNOSIS — G40909 Epilepsy, unspecified, not intractable, without status epilepticus: Secondary | ICD-10-CM
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
## 2017-12-27 NOTE — Telephone Encounter (Signed)
I called the patient. The EEG study is normal.

## 2018-01-31 ENCOUNTER — Encounter

## 2018-01-31 ENCOUNTER — Ambulatory Visit: Payer: Self-pay | Admitting: Neurology

## 2018-02-22 ENCOUNTER — Other Ambulatory Visit: Payer: Self-pay | Admitting: Family Medicine

## 2018-02-22 DIAGNOSIS — E440 Moderate protein-calorie malnutrition: Secondary | ICD-10-CM | POA: Diagnosis not present

## 2018-02-22 DIAGNOSIS — I10 Essential (primary) hypertension: Secondary | ICD-10-CM | POA: Diagnosis not present

## 2018-02-22 DIAGNOSIS — E041 Nontoxic single thyroid nodule: Secondary | ICD-10-CM | POA: Diagnosis not present

## 2018-02-22 DIAGNOSIS — Z87898 Personal history of other specified conditions: Secondary | ICD-10-CM | POA: Diagnosis not present

## 2018-02-22 DIAGNOSIS — D649 Anemia, unspecified: Secondary | ICD-10-CM | POA: Diagnosis not present

## 2018-02-22 DIAGNOSIS — E039 Hypothyroidism, unspecified: Secondary | ICD-10-CM | POA: Diagnosis not present

## 2018-02-22 DIAGNOSIS — F320 Major depressive disorder, single episode, mild: Secondary | ICD-10-CM | POA: Diagnosis not present

## 2018-02-22 DIAGNOSIS — R809 Proteinuria, unspecified: Secondary | ICD-10-CM | POA: Diagnosis not present

## 2018-02-22 DIAGNOSIS — N182 Chronic kidney disease, stage 2 (mild): Secondary | ICD-10-CM | POA: Diagnosis not present

## 2018-02-27 ENCOUNTER — Ambulatory Visit
Admission: RE | Admit: 2018-02-27 | Discharge: 2018-02-27 | Disposition: A | Discharge status: Home / Self Care | Payer: Medicare Other | Source: Ambulatory Visit | Attending: Family Medicine | Admitting: Family Medicine

## 2018-02-27 DIAGNOSIS — E041 Nontoxic single thyroid nodule: Secondary | ICD-10-CM

## 2018-02-27 DIAGNOSIS — E049 Nontoxic goiter, unspecified: Secondary | ICD-10-CM | POA: Diagnosis not present

## 2018-04-30 ENCOUNTER — Telehealth: Payer: Self-pay | Admitting: Neurology

## 2018-04-30 MED ORDER — LEVETIRACETAM 500 MG PO TABS: 500.0000 mg | ORAL_TABLET | Freq: Two times a day (BID) | ORAL | 3 refills | Status: DC

## 2018-04-30 NOTE — Telephone Encounter (Signed)
Chart reviewed and refill is appropriate. Refill electronically submitted.

## 2018-04-30 NOTE — Telephone Encounter (Signed)
Pt is needing a refill on her levETIRAcetam (KEPPRA) 500 MG tablet sent to [facility] in [facility]

## 2018-07-23 ENCOUNTER — Emergency Department (HOSPITAL_COMMUNITY)
Admission: EM | Admit: 2018-07-23 | Discharge: 2018-07-23 | Disposition: A | Discharge status: Home / Self Care | Payer: Medicare Other | Attending: Emergency Medicine | Admitting: Emergency Medicine

## 2018-07-23 ENCOUNTER — Other Ambulatory Visit: Payer: Self-pay

## 2018-07-23 ENCOUNTER — Emergency Department (HOSPITAL_COMMUNITY): Payer: Medicare Other

## 2018-07-23 DIAGNOSIS — Z7982 Long term (current) use of aspirin: Secondary | ICD-10-CM | POA: Diagnosis not present

## 2018-07-23 DIAGNOSIS — R002 Palpitations: Secondary | ICD-10-CM | POA: Insufficient documentation

## 2018-07-23 DIAGNOSIS — Z79899 Other long term (current) drug therapy: Secondary | ICD-10-CM | POA: Insufficient documentation

## 2018-07-23 DIAGNOSIS — R9431 Abnormal electrocardiogram [ECG] [EKG]: Secondary | ICD-10-CM | POA: Insufficient documentation

## 2018-07-23 DIAGNOSIS — I10 Essential (primary) hypertension: Secondary | ICD-10-CM | POA: Insufficient documentation

## 2018-07-23 DIAGNOSIS — E039 Hypothyroidism, unspecified: Secondary | ICD-10-CM | POA: Insufficient documentation

## 2018-07-23 DIAGNOSIS — Z87891 Personal history of nicotine dependence: Secondary | ICD-10-CM | POA: Diagnosis not present

## 2018-07-23 DIAGNOSIS — I700 Atherosclerosis of aorta: Secondary | ICD-10-CM | POA: Diagnosis not present

## 2018-07-23 DIAGNOSIS — R918 Other nonspecific abnormal finding of lung field: Secondary | ICD-10-CM | POA: Diagnosis not present

## 2018-07-23 LAB — COMPREHENSIVE METABOLIC PANEL
ALT: 11 U/L (ref 0–44)
AST: 24 U/L (ref 15–41)
Albumin: 4.4 g/dL (ref 3.5–5.0)
Alkaline Phosphatase: 58 U/L (ref 38–126)
Anion gap: 12 (ref 5–15)
BUN: 7 mg/dL — ABNORMAL LOW (ref 8–23)
CO2: 23 mmol/L (ref 22–32)
Calcium: 9.7 mg/dL (ref 8.9–10.3)
Chloride: 106 mmol/L (ref 98–111)
Creatinine, Ser: 0.89 mg/dL (ref 0.44–1.00)
GFR calc Af Amer: >60 mL/min (ref >60)
GFR calc non Af Amer: >60 mL/min (ref >60)
Glucose, Bld: 84 mg/dL (ref 70–99)
Potassium: 3.8 mmol/L (ref 3.5–5.1)
Sodium: 141 mmol/L (ref 135–145)
Total Bilirubin: 1 mg/dL (ref 0.3–1.2)
Total Protein: 7.3 g/dL (ref 6.5–8.1)

## 2018-07-23 LAB — CBC WITH DIFFERENTIAL/PLATELET
Abs Immature Granulocytes: 0.03 10*3/uL (ref 0.00–0.07)
Basophils Absolute: 0 10*3/uL (ref 0.0–0.1)
Basophils Relative: 0 %
Eosinophils Absolute: 0.1 10*3/uL (ref 0.0–0.5)
Eosinophils Relative: 1 %
HCT: 31.3 % — ABNORMAL LOW (ref 36.0–46.0)
Hemoglobin: 10.8 g/dL — ABNORMAL LOW (ref 12.0–15.0)
Immature Granulocytes: 0 %
Lymphocytes Relative: 30 %
Lymphs Abs: 2.4 10*3/uL (ref 0.7–4.0)
MCH: 29.5 pg (ref 26.0–34.0)
MCHC: 34.5 g/dL (ref 30.0–36.0)
MCV: 85.5 fL (ref 80.0–100.0)
Monocytes Absolute: 0.5 10*3/uL (ref 0.1–1.0)
Monocytes Relative: 7 %
Neutro Abs: 4.9 10*3/uL (ref 1.7–7.7)
Neutrophils Relative %: 62 %
Platelets: 276 10*3/uL (ref 150–400)
RBC: 3.66 MIL/uL — ABNORMAL LOW (ref 3.87–5.11)
RDW: 14.1 % (ref 11.5–15.5)
WBC: 7.9 10*3/uL (ref 4.0–10.5)
nRBC: 0 % (ref 0.0–0.2)

## 2018-07-23 LAB — MAGNESIUM: Magnesium: 1.8 mg/dL (ref 1.7–2.4)

## 2018-07-23 LAB — TROPONIN I (HIGH SENSITIVITY)
Troponin I (High Sensitivity): 5 ng/L (ref <18)
Troponin I (High Sensitivity): 5 ng/L (ref <18)

## 2018-07-23 LAB — CBG MONITORING, ED: Glucose-Capillary: 87 mg/dL (ref 70–99)

## 2018-07-23 LAB — T4, FREE: Free T4: 1.13 ng/dL — ABNORMAL HIGH (ref 0.61–1.12)

## 2018-07-23 LAB — TSH: TSH: 0.819 u[IU]/mL (ref 0.350–4.500)

## 2018-07-23 MED ORDER — SODIUM CHLORIDE 0.9 % IV SOLN: INTRAVENOUS | Status: DC

## 2018-07-23 NOTE — ED Notes (Signed)
Pt verbalized understanding of d/c instructions and has no further questions, VSS, NAD.  

## 2018-07-23 NOTE — Discharge Instructions (Signed)
As discussed, your evaluation today has been largely reassuring.  But, it is important that you monitor your condition carefully, and do not hesitate to return to the ED if you develop new, or concerning changes in your condition. ? ?Otherwise, please follow-up with your physician for appropriate ongoing care. ? ?

## 2018-07-23 NOTE — ED Provider Notes (Signed)
[facility] EMERGENCY DEPARTMENT Provider Note   CSN: [id number] Arrival date & time: 07/23/18  3545    History   Chief Complaint Chief Complaint  Patient presents with  . Abnormal ECG    HPI Maria Livingston is a 73 y.o. female.     HPI  Patient presents with concern of palpitations, diaphoresis, lightheadedness. She notes that over the past week she has had similar episodes, though none as profound as today. Currently she has no pain, no lightheadedness, states that she feels somewhat better. These episodes been occurring without clear precipitation. After episode began 1 week ago she called her physician, was encouraged to be evaluated, and now with worsening symptoms she is here. She notes that beyond the thyroid disease she has no substantial medical problems. She takes her medication regularly, daily. In addition, the patient notes that she drinks 1 glass of wine daily, eats bacon daily as well.  Past Medical History:  Diagnosis Date  . Estrogen deficiency   . Hypertension   . Hypothyroid   . Seizure disorder ([place]) 11/29/2017  . Seizures ([place])   . Vision abnormalities     Patient Active Problem List   Diagnosis Date Noted  . Seizure disorder ([place]) 11/29/2017    Past Surgical History:  Procedure Laterality Date  . ABDOMINAL HYSTERECTOMY    . BREAST CYST EXCISION Bilateral 1970  . CYSTECTOMY     bilateral breast  . THYROID SURGERY    . TOE SURGERY    . TONSILLECTOMY       OB History    Gravida  2   Para  1   Term  1   Preterm      AB  1   Living        SAB      TAB      Ectopic      Multiple      Live Births               Home Medications    Prior to Admission medications   Medication Sig Start Date End Date Taking? Authorizing Provider  aspirin EC 81 MG tablet Take 81 mg by mouth daily.   Yes [provider]  levETIRAcetam (KEPPRA) 500 MG tablet Take 1 tablet (500 mg total) by mouth every 12  (twelve) hours. 04/30/18  Yes Kathrynn Ducking, MD  levothyroxine (SYNTHROID, LEVOTHROID) 50 MCG tablet Take 25 mcg by mouth daily.    Yes [provider]  losartan-hydrochlorothiazide (HYZAAR) 50-12.5 MG per tablet Take 1 tablet by mouth daily.   Yes [provider]  mirtazapine (REMERON) 15 MG tablet Take 15 mg by mouth at bedtime.   Yes [provider]    Family History Family History  Problem Relation Age of Onset  . Osteoarthritis Mother   . Hypertension Mother   . Hypertension Father   . Stroke Father   . Hypertension Brother   . Prostate cancer Brother     Social History Social History   Tobacco Use  . Smoking status: Former Smoker    Packs/day: 1.00    Types: Cigarettes    Quit date: 07/02/2004    Years since quitting: 14.0  . Smokeless tobacco: Never Used  Substance Use Topics  . Alcohol use: Yes    Alcohol/week: 0.0 standard drinks    Comment: 1 glass of wine per day  . Drug use: No     Allergies   Patient has no known  allergies.   Review of Systems Review of Systems  Constitutional:       Per HPI, otherwise negative  HENT:       Per HPI, otherwise negative  Respiratory:       Per HPI, otherwise negative  Cardiovascular:       Per HPI, otherwise negative  Gastrointestinal: Negative for vomiting.  Endocrine:       Negative aside from HPI  Genitourinary:       Neg aside from HPI   Musculoskeletal:       Per HPI, otherwise negative  Skin: Negative.   Neurological: Negative for syncope.     Physical Exam Updated Vital Signs BP 131/70   Pulse 76   Temp 98.9 F (37.2 C) (Oral)   Resp 17   SpO2 99%   Physical Exam Vitals signs and nursing note reviewed.  Constitutional:      General: She is not in acute distress.    Appearance: She is well-developed.  HENT:     Head: Normocephalic and atraumatic.  Eyes:     Conjunctiva/sclera: Conjunctivae normal.  Cardiovascular:     Rate and Rhythm: Normal rate and regular  rhythm.  Pulmonary:     Effort: Pulmonary effort is normal. No respiratory distress.     Breath sounds: Normal breath sounds. No stridor.  Abdominal:     General: There is no distension.  Skin:    General: Skin is warm and dry.  Neurological:     Mental Status: She is alert and oriented to person, place, and time.     Cranial Nerves: No cranial nerve deficit.      ED Treatments / Results  Labs (all labs ordered are listed, but only abnormal results are displayed) Labs Reviewed  COMPREHENSIVE METABOLIC PANEL - Abnormal; Notable for the following components:      Result Value   BUN 7 (*)    All other components within normal limits  CBC WITH DIFFERENTIAL/PLATELET - Abnormal; Notable for the following components:   RBC 3.66 (*)    Hemoglobin 10.8 (*)    HCT 31.3 (*)    All other components within normal limits  T4, FREE - Abnormal; Notable for the following components:   Free T4 1.13 (*)    All other components within normal limits  MAGNESIUM  TROPONIN I (HIGH SENSITIVITY)  TSH  TROPONIN I (HIGH SENSITIVITY)  CBG MONITORING, ED   EKG from the physician's office is similar to that here, with diffuse ST changes, otherwise unremarkable.   EKG EKG Interpretation  Date/Time:  Monday July 23 2018 09:23:16 EDT Ventricular Rate:  78 PR Interval:    QRS Duration: 74 QT Interval:  357 QTC Calculation: 407 R Axis:   80 Text Interpretation:  Sinus rhythm Atrial premature complex Biatrial enlargement Borderline repol abnormality, diffuse leads ST-t wave abnormality Abnormal ECG Confirmed by Carmin Muskrat [phone]) on 07/23/2018 9:27:17 AM   Radiology Dg Chest Port 1 View  Result Date: 07/23/2018 CLINICAL DATA:  Pt states feeling like heart is racing for 5 days Order for abnormal ECG EXAM: PORTABLE CHEST 1 VIEW COMPARISON:  None. FINDINGS: The lungs are mildly hyperinflated. Heart size is normal. There is deviation of the trachea towards the LEFT, cyst suspicious for enlarged  thyroid. The lungs are clear. No pulmonary edema or consolidation. There is minimal focal atherosclerotic calcification of the thoracic aorta. IMPRESSION: 1. Hyperinflation. 2. Suspect enlarged thyroid. Recommend follow-up thyroid ultrasound. 3.  There is atherosclerotic calcification of the  thoracic aorta. Electronically Signed   By: Nolon Nations M.D.   On: 07/23/2018 09:42    Procedures Procedures (including critical care time)  Medications Ordered in ED Medications  0.9 %  sodium chloride infusion (has no administration in time range)     Initial Impression / Assessment and Plan / ED Course  I have reviewed the triage vital signs and the nursing notes.  Pertinent labs & imaging results that were available during my care of the patient were reviewed by me and considered in my medical decision making (see chart for details).        1:33 PM Patient in no distress, awake, alert, sitting upright. Cardiac monitor unremarkable, EKG is abnormal, with some mild changes from prior, but with 2 normal troponin, no ongoing chest pain, no ongoing complaints, and duration of symptoms for greater than 1 week, patient is appropriate for further evaluation, management to occur as an outpatient. As above with reassuring findings, no evidence for ACS, no evidence for PE, pneumonia, bacteremia, sepsis. Patient amenable to outpatient follow-up, understands importance of returning here for concerning changes or following up with cardiology.  Final Clinical Impressions(s) / ED Diagnoses   Final diagnoses:  Palpitations  Abnormal EKG     Carmin Muskrat, MD 07/23/18 1335

## 2018-07-23 NOTE — ED Triage Notes (Signed)
Pt here from PCP office for abnormal ekg, changed since last visit. Denies chest pain. Endorses feeling like her heart was racing all night and diaphoresis, which caused her to go to the doctor today.

## 2018-07-25 ENCOUNTER — Other Ambulatory Visit: Payer: Self-pay | Admitting: Medical

## 2018-07-25 ENCOUNTER — Encounter (HOSPITAL_COMMUNITY): Payer: Self-pay

## 2018-07-25 ENCOUNTER — Emergency Department (HOSPITAL_COMMUNITY): Payer: Medicare Other

## 2018-07-25 ENCOUNTER — Other Ambulatory Visit: Payer: Self-pay

## 2018-07-25 ENCOUNTER — Emergency Department (HOSPITAL_COMMUNITY)
Admission: EM | Admit: 2018-07-25 | Discharge: 2018-07-25 | Disposition: A | Discharge status: Home / Self Care | Payer: Medicare Other | Attending: Emergency Medicine | Admitting: Emergency Medicine

## 2018-07-25 DIAGNOSIS — R42 Dizziness and giddiness: Secondary | ICD-10-CM

## 2018-07-25 DIAGNOSIS — E039 Hypothyroidism, unspecified: Secondary | ICD-10-CM | POA: Insufficient documentation

## 2018-07-25 DIAGNOSIS — Z03818 Encounter for observation for suspected exposure to other biological agents ruled out: Secondary | ICD-10-CM | POA: Diagnosis not present

## 2018-07-25 DIAGNOSIS — Z20828 Contact with and (suspected) exposure to other viral communicable diseases: Secondary | ICD-10-CM | POA: Insufficient documentation

## 2018-07-25 DIAGNOSIS — Z79899 Other long term (current) drug therapy: Secondary | ICD-10-CM | POA: Insufficient documentation

## 2018-07-25 DIAGNOSIS — Z7982 Long term (current) use of aspirin: Secondary | ICD-10-CM | POA: Insufficient documentation

## 2018-07-25 DIAGNOSIS — I10 Essential (primary) hypertension: Secondary | ICD-10-CM

## 2018-07-25 DIAGNOSIS — R002 Palpitations: Secondary | ICD-10-CM | POA: Diagnosis not present

## 2018-07-25 DIAGNOSIS — I491 Atrial premature depolarization: Secondary | ICD-10-CM | POA: Insufficient documentation

## 2018-07-25 DIAGNOSIS — Z87891 Personal history of nicotine dependence: Secondary | ICD-10-CM | POA: Insufficient documentation

## 2018-07-25 DIAGNOSIS — E049 Nontoxic goiter, unspecified: Secondary | ICD-10-CM

## 2018-07-25 LAB — TROPONIN I (HIGH SENSITIVITY)
Troponin I (High Sensitivity): 6 ng/L (ref <18)
Troponin I (High Sensitivity): 6 ng/L (ref <18)

## 2018-07-25 LAB — CBC WITH DIFFERENTIAL/PLATELET
Abs Immature Granulocytes: 0.03 10*3/uL (ref 0.00–0.07)
Basophils Absolute: 0 10*3/uL (ref 0.0–0.1)
Basophils Relative: 0 %
Eosinophils Absolute: 0.1 10*3/uL (ref 0.0–0.5)
Eosinophils Relative: 1 %
HCT: 32 % — ABNORMAL LOW (ref 36.0–46.0)
Hemoglobin: 10.6 g/dL — ABNORMAL LOW (ref 12.0–15.0)
Immature Granulocytes: 0 %
Lymphocytes Relative: 31 %
Lymphs Abs: 2.1 10*3/uL (ref 0.7–4.0)
MCH: 28.9 pg (ref 26.0–34.0)
MCHC: 33.1 g/dL (ref 30.0–36.0)
MCV: 87.2 fL (ref 80.0–100.0)
Monocytes Absolute: 0.5 10*3/uL (ref 0.1–1.0)
Monocytes Relative: 7 %
Neutro Abs: 4.1 10*3/uL (ref 1.7–7.7)
Neutrophils Relative %: 61 %
Platelets: 244 10*3/uL (ref 150–400)
RBC: 3.67 MIL/uL — ABNORMAL LOW (ref 3.87–5.11)
RDW: 13.9 % (ref 11.5–15.5)
WBC: 6.7 10*3/uL (ref 4.0–10.5)
nRBC: 0 % (ref 0.0–0.2)

## 2018-07-25 LAB — TSH: TSH: 0.994 u[IU]/mL (ref 0.350–4.500)

## 2018-07-25 LAB — LIPASE, BLOOD: Lipase: 25 U/L (ref 11–51)

## 2018-07-25 LAB — COMPREHENSIVE METABOLIC PANEL
ALT: 16 U/L (ref 0–44)
AST: 25 U/L (ref 15–41)
Albumin: 4.3 g/dL (ref 3.5–5.0)
Alkaline Phosphatase: 64 U/L (ref 38–126)
Anion gap: 11 (ref 5–15)
BUN: 7 mg/dL — ABNORMAL LOW (ref 8–23)
CO2: 22 mmol/L (ref 22–32)
Calcium: 9.5 mg/dL (ref 8.9–10.3)
Chloride: 107 mmol/L (ref 98–111)
Creatinine, Ser: 0.86 mg/dL (ref 0.44–1.00)
GFR calc Af Amer: >60 mL/min (ref >60)
GFR calc non Af Amer: >60 mL/min (ref >60)
Glucose, Bld: 90 mg/dL (ref 70–99)
Potassium: 3.7 mmol/L (ref 3.5–5.1)
Sodium: 140 mmol/L (ref 135–145)
Total Bilirubin: 0.6 mg/dL (ref 0.3–1.2)
Total Protein: 7 g/dL (ref 6.5–8.1)

## 2018-07-25 LAB — SARS CORONAVIRUS 2 BY RT PCR (HOSPITAL ORDER, PERFORMED IN ~~LOC~~ HOSPITAL LAB): SARS Coronavirus 2: NEGATIVE

## 2018-07-25 NOTE — ED Triage Notes (Signed)
Pt stated she woke up around 0200 having night sweats & chest palpitations & that the palpitations continues until now.

## 2018-07-25 NOTE — Consult Note (Addendum)
Cardiology Consultation:   Patient ID: Maria Livingston MRN: [id number]; DOB: 1945-02-07  Admit date: 07/25/2018 Date of Consult: 07/25/2018  Primary Care Provider: Lois Huxley, PA Primary Cardiologist: No primary care provider on file.  Primary Electrophysiologist:  None    Patient Profile:   Maria Livingston is a 73 y.o. female with a hx of HTN, Hyperthyroidism, former smoker, and seizures who is being seen today for the evaluation of palpitations at the request of Dr. Vanita Panda.  Previous Cardiac history includes HTN on Losartan-HCTZ 50-12.5mg  per PCP and visit with Dr. Meda Coffee for palpitations on 10/26/2017 (records don't appear in the system). At that time she ordered a Holter Monitor 48 hr which showed Sinus bradycardia to sinus tachycardia. Rare PVCs and infrequent PACs (800 in 48 hours).  No pauses, runs or significant arrhythmias. No further cardiac work-up pursued.  History of Present Illness:   [name] presented to the ER with night sweats and palpitations. Patient had been discharged from the ER 2 days (7/6) ago for the same symptoms and had been feeling well. However, symptoms of racing heart and night sweats returned. Patient first noticed symptoms about 1 week ago, Thursday while in her bed. No clear precipitation. Denies sob, chest pain, n/v. She did have associated pre-syncope, but denies syncope. She did notice pedal edema last night. Denies orthopnea. Negative for fever, cough, recent illness. Hyperthyroidism on synthroid. 2 cups of coffee in the morning. 1 glass of wine daily. Patient stays well hydrated. Family history positive for father with high blood pressure. Patient had been taking B/P readings at home with normal systolic around 557.   In the ER B/P 160/125 , Pulse 67, Resp 17, O2 100%. HMG 10.6. TSH 0.994. CXR negative. Troponins 5>6>6. EKG today NSR, HR 75 with LVH and repolarization abnormality.  Not much different than 2 days ago. Tele with multiple PACs.   07/23/18  Patient presented to the ER with palpitations, lightheadedness, and diaphoresis. HS Troponin negative. EKG NSR, HR 78, with 1 PAC. Discharged for further outpatient work-up.    Heart Pathway Score:     Past Medical History:  Diagnosis Date  . Estrogen deficiency   . Hypertension   . Hypothyroid   . Seizure disorder ([place]) 11/29/2017  . Seizures ([place])   . Vision abnormalities     Past Surgical History:  Procedure Laterality Date  . ABDOMINAL HYSTERECTOMY    . BREAST CYST EXCISION Bilateral 1970  . CYSTECTOMY     bilateral breast  . THYROID SURGERY    . TOE SURGERY    . TONSILLECTOMY       Home Medications:  Prior to Admission medications   Medication Sig Start Date End Date Taking? Authorizing Provider  aspirin EC 81 MG tablet Take 81 mg by mouth daily.    [provider]  levETIRAcetam (KEPPRA) 500 MG tablet Take 1 tablet (500 mg total) by mouth every 12 (twelve) hours. 04/30/18   Kathrynn Ducking, MD  levothyroxine (SYNTHROID, LEVOTHROID) 50 MCG tablet Take 25 mcg by mouth daily.     [provider]  losartan-hydrochlorothiazide (HYZAAR) 50-12.5 MG per tablet Take 1 tablet by mouth daily.    [provider]  mirtazapine (REMERON) 15 MG tablet Take 15 mg by mouth at bedtime.    [provider]    Inpatient Medications: Scheduled Meds:  Continuous Infusions:  PRN Meds:   Allergies:   No Known Allergies  Social History:   Social History   Socioeconomic History  .  Marital status: Single    Spouse name: Not on file  . Number of children: Not on file  . Years of education: Not on file  . Highest education level: Not on file  Occupational History  . Not on file  Social Needs  . Financial resource strain: Not on file  . Food insecurity    Worry: Not on file    Inability: Not on file  . Transportation needs    Medical: Not on file    Non-medical: Not on file  Tobacco Use  . Smoking status: Former Smoker    Packs/day: 1.00     Types: Cigarettes    Quit date: 07/02/2004    Years since quitting: 14.0  . Smokeless tobacco: Never Used  Substance and Sexual Activity  . Alcohol use: Yes    Alcohol/week: 0.0 standard drinks    Comment: 1 glass of wine per day  . Drug use: No  . Sexual activity: Not Currently  Lifestyle  . Physical activity    Days per week: Not on file    Minutes per session: Not on file  . Stress: Not on file  Relationships  . Social Herbalist on phone: Not on file    Gets together: Not on file    Attends religious service: Not on file    Active member of club or organization: Not on file    Attends meetings of clubs or organizations: Not on file    Relationship status: Not on file  . Intimate partner violence    Fear of current or ex partner: Not on file    Emotionally abused: Not on file    Physically abused: Not on file    Forced sexual activity: Not on file  Other Topics Concern  . Not on file  Social History Narrative  . Not on file    Family History:   Family History  Problem Relation Age of Onset  . Osteoarthritis Mother   . Hypertension Mother   . Hypertension Father   . Stroke Father   . Hypertension Brother   . Prostate cancer Brother      ROS:  Please see the history of present illness.  All other ROS reviewed and negative.     Physical Exam/Data:   Vitals:   07/25/18 1200 07/25/18 1230 07/25/18 1300 07/25/18 1400  BP: 139/72 140/72 (!) 155/82 (!) 149/90  Pulse: 69 72 74 75  Resp: (!) 21 (!) 23 (!) 21 18  Temp:      TempSrc:      SpO2: 100% 100% 100% 99%  Weight:      Height:       No intake or output data in the 24 hours ending 07/25/18 1534 Last 3 Weights 07/25/2018 11/29/2017 10/03/2011  Weight (lbs) 126 lb 111 lb 151 lb  Weight (kg) 57.153 kg 50.349 kg 68.493 kg     Body mass index is 21.63 kg/m.  General:  Well nourished, well developed, in no acute distress HEENT: normal Neck: no JVD, Goiter, more prominent on the right side  Endocrine:  No thryomegaly Vascular: +carotid bruit left side; FA pulses 2+ bilaterally without bruits  Cardiac:  normal S1, S2; RRR; no murmur  Lungs:  clear to auscultation bilaterally, no wheezing, rhonchi or rales  Abd: soft, nontender, no hepatomegaly  Ext: no edema Musculoskeletal:  No deformities, BUE and BLE strength normal and equal Skin: warm and dry  Neuro:  CNs 2-12 intact, no focal  abnormalities noted Psych:  Normal affect   EKG:  The EKG was personally reviewed and demonstrates:  EKG today NSR, HR 75 with LVH and repolarization abnormality.  Telemetry:  Telemetry was personally reviewed and demonstrates:  NSR, HR 60-70s with multiple PACs  Relevant CV Studies:  48 hr Holter Monitor 10/26/2017: Sinus bradycardia to sinus tachycardia. Rare PVCs and infrequent PACs (800 in 48 hours).  No pauses, runs or significant arrhythmias..  Laboratory Data:  High Sensitivity Troponin:   Recent Labs  Lab 07/23/18 0957 07/23/18 1150 07/25/18 0841 07/25/18 1054  TROPONINIHS 5 5 6 6      Cardiac EnzymesNo results for input(s): TROPONINI in the last 168 hours. No results for input(s): TROPIPOC in the last 168 hours.  Chemistry Recent Labs  Lab 07/23/18 0957 07/25/18 0841  NA 141 140  K 3.8 3.7  CL 106 107  CO2 23 22  GLUCOSE 84 90  BUN 7* 7*  CREATININE 0.89 0.86  CALCIUM 9.7 9.5  GFRNONAA >60 >60  GFRAA >60 >60  ANIONGAP 12 11    Recent Labs  Lab 07/23/18 0957 07/25/18 0841  PROT 7.3 7.0  ALBUMIN 4.4 4.3  AST 24 25  ALT 11 16  ALKPHOS 58 64  BILITOT 1.0 0.6   Hematology Recent Labs  Lab 07/23/18 0957 07/25/18 0841  WBC 7.9 6.7  RBC 3.66* 3.67*  HGB 10.8* 10.6*  HCT 31.3* 32.0*  MCV 85.5 87.2  MCH 29.5 28.9  MCHC 34.5 33.1  RDW 14.1 13.9  PLT 276 244   BNPNo results for input(s): BNP, PROBNP in the last 168 hours.  DDimer No results for input(s): DDIMER in the last 168 hours.   Radiology/Studies:  Dg Chest Port 1 View  Result Date: 07/25/2018  CLINICAL DATA:  Palpitation and night sweats since Thursday. EXAM: PORTABLE CHEST 1 VIEW COMPARISON:  July 23, 2018 FINDINGS: The heart size and mediastinal contours are within normal limits. Both lungs are clear. The visualized skeletal structures are unremarkable. IMPRESSION: No active disease. Electronically Signed   By: Abelardo Diesel M.D.   On: 07/25/2018 08:30   Dg Chest Port 1 View  Result Date: 07/23/2018 CLINICAL DATA:  Pt states feeling like heart is racing for 5 days Order for abnormal ECG EXAM: PORTABLE CHEST 1 VIEW COMPARISON:  None. FINDINGS: The lungs are mildly hyperinflated. Heart size is normal. There is deviation of the trachea towards the LEFT, cyst suspicious for enlarged thyroid. The lungs are clear. No pulmonary edema or consolidation. There is minimal focal atherosclerotic calcification of the thoracic aorta. IMPRESSION: 1. Hyperinflation. 2. Suspect enlarged thyroid. Recommend follow-up thyroid ultrasound. 3.  There is atherosclerotic calcification of the thoracic aorta. Electronically Signed   By: Nolon Nations M.D.   On: 07/23/2018 09:42    Assessment and Plan:   1. Palpitations -This is the patient's second presentation to the ER in the last week for palpitations, lightheadedness, and diaphoresis. Denies CP/SOB/Syncope. No precipitating event.  -In the ER EKG in NSR, HR 75, with some LVH and repolarization abnormalities -HS Troponin 5>6>6 -TSH, HMG, and electrolytes all wnl -Patient drinks 2 cups of coffee, but nothing excessive recently -Tele shows multiple PACS  -Previous Holter monitor 10/2017 showed Sinus bradycardia to sinus tachycardia. Rare PVCs and infrequent PACs (800 in 48 hours).  No pauses, runs or significant arrhythmias. -After exam there is no obvious reason for admission -Will order Echo and Zio patch  -Recommend stopping caffeine  - F/u 2 week outpatient with APP  2.HTN - Losartan-HCTZ 50-12.5mg , home med - Patient recently bought B/P cuff and at  home normal systolic around 681. -Slightly Elevated in ER  3. Hyperthyroidism - synthroid, home med  - On exam patient has prominent goiter - TSH checked today 0.994    For questions or updates, please contact [facility] Please consult [email or web address] for contact info under     Signed, Cadence Ninfa Meeker, PA-C  07/25/2018 3:34 PM   Agree with note by Cadence Jorene Minors  We are asked to see Ms. Evelene Croon for palpitations.  She has seen Dr. Ena Dawley in the office late last year.  She has no cardiac history.  She does drink several cups of coffee and soft drinks a day.  She has a history of thyroid disease on replacement.  She was seen in the ER several days ago with palpitations with a negative work-up.  She returns today for similar symptoms.  She denies chest pain or shortness of breath.  Her enzymes are negative.  Her EKG does show LVH.  Her exam is benign.  We talked about limiting her caffeine intake.  She stable for discharge home.  We will get a 30-day event monitor and 2D echocardiogram.   Lorretta Harp, M.D., [place], [facility], [place], [place] [address] [place], [place]  [address]  [phone] 07/25/2018 3:57 PM

## 2018-07-25 NOTE — ED Provider Notes (Signed)
[facility] EMERGENCY DEPARTMENT Provider Note   CSN: [id number] Arrival date & time: 07/25/18  4268    History   Chief Complaint Chief Complaint  Patient presents with  . Palpitations  . Night Sweats    HPI Maria Livingston is a 73 y.o. female.     HPI  Presents with concern of night sweats, palpitations. She notes that after discharge 2 days ago from the emergency department she was feeling generally well, but soon thereafter had return of sensation of racing heart, and has now had episodes of night sweats. Pain is vaguely described, and it seems more consistent with discomfort secondary to her recent sensation, without new dyspnea, without fever, without cough. No other new pain.  When she continues to take her thyroid medication as directed, maintains a similar diet of bacon once daily, 1 glass of wine daily. She has not yet followed up with her physicians.  Past Medical History:  Diagnosis Date  . Estrogen deficiency   . Hypertension   . Hypothyroid   . Seizure disorder ([place]) 11/29/2017  . Seizures ([place])   . Vision abnormalities     Patient Active Problem List   Diagnosis Date Noted  . Seizure disorder ([place]) 11/29/2017    Past Surgical History:  Procedure Laterality Date  . ABDOMINAL HYSTERECTOMY    . BREAST CYST EXCISION Bilateral 1970  . CYSTECTOMY     bilateral breast  . THYROID SURGERY    . TOE SURGERY    . TONSILLECTOMY       OB History    Gravida  2   Para  1   Term  1   Preterm      AB  1   Living        SAB      TAB      Ectopic      Multiple      Live Births               Home Medications    Prior to Admission medications   Medication Sig Start Date End Date Taking? Authorizing Provider  aspirin EC 81 MG tablet Take 81 mg by mouth daily.    [provider]  levETIRAcetam (KEPPRA) 500 MG tablet Take 1 tablet (500 mg total) by mouth every 12 (twelve) hours. 04/30/18   Kathrynn Ducking, MD   levothyroxine (SYNTHROID, LEVOTHROID) 50 MCG tablet Take 25 mcg by mouth daily.     [provider]  losartan-hydrochlorothiazide (HYZAAR) 50-12.5 MG per tablet Take 1 tablet by mouth daily.    [provider]  mirtazapine (REMERON) 15 MG tablet Take 15 mg by mouth at bedtime.    [provider]    Family History Family History  Problem Relation Age of Onset  . Osteoarthritis Mother   . Hypertension Mother   . Hypertension Father   . Stroke Father   . Hypertension Brother   . Prostate cancer Brother     Social History Social History   Tobacco Use  . Smoking status: Former Smoker    Packs/day: 1.00    Types: Cigarettes    Quit date: 07/02/2004    Years since quitting: 14.0  . Smokeless tobacco: Never Used  Substance Use Topics  . Alcohol use: Yes    Alcohol/week: 0.0 standard drinks    Comment: 1 glass of wine per day  . Drug use: No     Allergies   Patient has no known allergies.  Review of Systems Review of Systems  Constitutional:       Per HPI, otherwise negative  HENT:       Per HPI, otherwise negative  Respiratory:       Per HPI, otherwise negative  Cardiovascular:       Per HPI, otherwise negative  Gastrointestinal: Negative for vomiting.  Endocrine:       Negative aside from HPI  Genitourinary:       Neg aside from HPI   Musculoskeletal:       Per HPI, otherwise negative  Skin: Negative.   Neurological: Negative for syncope.     Physical Exam Updated Vital Signs BP (!) 120/95   Pulse 67   Temp 98.6 F (37 C) (Oral)   Resp 17   Ht 5\' 4"  (1.626 m)   Wt 57.2 kg   SpO2 100%   BMI 21.63 kg/m   Physical Exam Vitals signs and nursing note reviewed.  Constitutional:      General: She is not in acute distress.    Appearance: She is well-developed.  HENT:     Head: Normocephalic and atraumatic.  Eyes:     Conjunctiva/sclera: Conjunctivae normal.  Cardiovascular:     Rate and Rhythm: Normal rate and regular  rhythm.  Pulmonary:     Effort: Pulmonary effort is normal. No respiratory distress.     Breath sounds: Normal breath sounds. No stridor.  Abdominal:     General: There is no distension.  Skin:    General: Skin is warm and dry.  Neurological:     Mental Status: She is alert and oriented to person, place, and time.     Cranial Nerves: No cranial nerve deficit.      ED Treatments / Results  Labs (all labs ordered are listed, but only abnormal results are displayed) Labs Reviewed  COMPREHENSIVE METABOLIC PANEL - Abnormal; Notable for the following components:      Result Value   BUN 7 (*)    All other components within normal limits  CBC WITH DIFFERENTIAL/PLATELET - Abnormal; Notable for the following components:   RBC 3.67 (*)    Hemoglobin 10.6 (*)    HCT 32.0 (*)    All other components within normal limits  SARS CORONAVIRUS 2 (HOSPITAL ORDER, [facility])  LIPASE, BLOOD  TSH  TROPONIN I (HIGH SENSITIVITY)  TROPONIN I (HIGH SENSITIVITY)    EKG EKG Interpretation  Date/Time:  Wednesday July 25 2018 07:44:04 EDT Ventricular Rate:  75 PR Interval:  128 QRS Duration: 82 QT Interval:  372 QTC Calculation: 415 R Axis:   81 Text Interpretation:  Normal sinus rhythm Left ventricular hypertrophy with repolarization abnormality ST-t wave abnormality No significant change since last tracing Abnormal ECG Confirmed by Carmin Muskrat [phone]) on 07/25/2018 7:47:55 AM   Radiology Dg Chest Port 1 View  Result Date: 07/25/2018 CLINICAL DATA:  Palpitation and night sweats since Thursday. EXAM: PORTABLE CHEST 1 VIEW COMPARISON:  July 23, 2018 FINDINGS: The heart size and mediastinal contours are within normal limits. Both lungs are clear. The visualized skeletal structures are unremarkable. IMPRESSION: No active disease. Electronically Signed   By: Abelardo Diesel M.D.   On: 07/25/2018 08:30    Procedures Procedures (including critical care time)  Medications  Ordered in ED Medications - No data to display   Initial Impression / Assessment and Plan / ED Course  I have reviewed the triage vital signs and the nursing notes.  Pertinent  labs & imaging results that were available during my care of the patient were reviewed by me and considered in my medical decision making (see chart for details).        11:20 AM Patient in no distress, awake, alert.  I reviewed labs, x-ray, COVID findings with her. Findings are similar to those of 2 days ago, but with ongoing palpitations, lightheadedness, and with consideration of her thyroid abnormalities, the patient left consultation with her cardiology colleagues.  Late addendum: Patient seen and evaluated by cardiology, discharge planning including cardiac monitor, echocardiogram executed. Final Clinical Impressions(s) / ED Diagnoses   Final diagnoses:  Palpitations     Carmin Muskrat, MD 07/28/18 1542

## 2018-07-25 NOTE — ED Notes (Signed)
Pt is NSR on monitor 

## 2018-07-30 ENCOUNTER — Telehealth: Payer: Self-pay | Admitting: *Deleted

## 2018-07-30 NOTE — Telephone Encounter (Signed)
14 ZIO XT long term holter monitor to be mailed to patients home.  Instructions reviewed briefly as they are include in the monitor kit.

## 2018-08-02 ENCOUNTER — Telehealth: Payer: Self-pay | Admitting: Neurology

## 2018-08-02 MED ORDER — LEVETIRACETAM 500 MG PO TABS: 500.0000 mg | ORAL_TABLET | Freq: Two times a day (BID) | ORAL | 3 refills | Status: DC

## 2018-08-02 NOTE — Telephone Encounter (Signed)
Pt is requesting a refill of levETIRAcetam (KEPPRA) 500 MG tablet , to be sent to [facility], [place]

## 2018-08-02 NOTE — Telephone Encounter (Signed)
Chart reviewed and refill is appropriate. Rx has been submitted.

## 2018-08-03 ENCOUNTER — Other Ambulatory Visit: Payer: Self-pay

## 2018-08-03 ENCOUNTER — Ambulatory Visit (HOSPITAL_COMMUNITY): Payer: Medicare Other | Attending: Internal Medicine

## 2018-08-03 DIAGNOSIS — R42 Dizziness and giddiness: Secondary | ICD-10-CM | POA: Diagnosis not present

## 2018-08-03 DIAGNOSIS — R002 Palpitations: Secondary | ICD-10-CM | POA: Insufficient documentation

## 2018-08-03 DIAGNOSIS — R55 Syncope and collapse: Secondary | ICD-10-CM | POA: Insufficient documentation

## 2018-08-05 ENCOUNTER — Ambulatory Visit (INDEPENDENT_AMBULATORY_CARE_PROVIDER_SITE_OTHER): Payer: Medicare Other

## 2018-08-05 DIAGNOSIS — R002 Palpitations: Secondary | ICD-10-CM | POA: Diagnosis not present

## 2018-08-05 DIAGNOSIS — R42 Dizziness and giddiness: Secondary | ICD-10-CM

## 2018-08-08 ENCOUNTER — Telehealth: Payer: Self-pay | Admitting: *Deleted

## 2018-08-08 ENCOUNTER — Telehealth: Payer: Self-pay | Admitting: Medical

## 2018-08-08 NOTE — Telephone Encounter (Signed)
Left message regarding appointment for 08/09/18 at 10:00 am with Jory Sims, DNP.

## 2018-08-09 ENCOUNTER — Ambulatory Visit (INDEPENDENT_AMBULATORY_CARE_PROVIDER_SITE_OTHER): Payer: Medicare Other | Admitting: General Practice

## 2018-08-09 ENCOUNTER — Other Ambulatory Visit: Payer: Self-pay | Admitting: *Deleted

## 2018-08-09 ENCOUNTER — Ambulatory Visit: Payer: Medicare Other | Admitting: Adult Health

## 2018-08-09 ENCOUNTER — Other Ambulatory Visit: Payer: Self-pay

## 2018-08-09 ENCOUNTER — Encounter: Payer: Self-pay | Admitting: Physician Assistant

## 2018-08-09 VITALS — BP 132/84 | HR 90 | Temp 98.4°F | Ht 64.0 in | Wt 126.0 lb

## 2018-08-09 DIAGNOSIS — R002 Palpitations: Secondary | ICD-10-CM | POA: Diagnosis not present

## 2018-08-09 DIAGNOSIS — I10 Essential (primary) hypertension: Secondary | ICD-10-CM

## 2018-08-09 DIAGNOSIS — R931 Abnormal findings on diagnostic imaging of heart and coronary circulation: Secondary | ICD-10-CM

## 2018-08-09 DIAGNOSIS — Z01810 Encounter for preprocedural cardiovascular examination: Secondary | ICD-10-CM | POA: Diagnosis not present

## 2018-08-09 LAB — BASIC METABOLIC PANEL
BUN/Creatinine Ratio: 10 — ABNORMAL LOW (ref 12–28)
BUN: 8 mg/dL (ref 8–27)
CO2: 23 mmol/L (ref 20–29)
Calcium: 9.8 mg/dL (ref 8.7–10.3)
Chloride: 103 mmol/L (ref 96–106)
Creatinine, Ser: 0.83 mg/dL (ref 0.57–1.00)
GFR calc Af Amer: 82 mL/min/{1.73_m2} (ref >59)
GFR calc non Af Amer: 71 mL/min/{1.73_m2} (ref >59)
Glucose: 80 mg/dL (ref 65–99)
Potassium: 3.6 mmol/L (ref 3.5–5.2)
Sodium: 144 mmol/L (ref 134–144)

## 2018-08-09 LAB — CBC
Hematocrit: 31.9 % — ABNORMAL LOW (ref 34.0–46.6)
Hemoglobin: 10.8 g/dL — ABNORMAL LOW (ref 11.1–15.9)
MCH: 29 pg (ref 26.6–33.0)
MCHC: 33.9 g/dL (ref 31.5–35.7)
MCV: 86 fL (ref 79–97)
Platelets: 267 10*3/uL (ref 150–450)
RBC: 3.72 x10E6/uL — ABNORMAL LOW (ref 3.77–5.28)
RDW: 12.8 % (ref 11.7–15.4)
WBC: 7.1 10*3/uL (ref 3.4–10.8)

## 2018-08-09 NOTE — Patient Instructions (Signed)
Medication Instructions:  Your physician recommends that you continue on your current medications as directed. Please refer to the Current Medication list given to you today.  If you need a refill on your cardiac medications before your next appointment, please call your pharmacy.   Lab work: You will need to have labs (blood work) drawn today for your upcoming TEE:  BMET  CBC  If you have labs (blood work) drawn today and your tests are completely normal, you will receive your results only by: Marland Kitchen MyChart Message (if you have MyChart) OR . A paper copy in the mail If you have any lab test that is abnormal or we need to change your treatment, we will call you to review the results.  Testing/Procedures: Your physician has requested that you have a TEE. During a TEE, sound waves are used to create images of your heart. It provides your doctor with information about the size and shape of your heart and how well your heart's chambers and valves are working. In this test, a transducer is attached to the end of a flexible tube that's guided down your throat and into your esophagus (the tube leading from you mouth to your stomach) to get a more detailed image of your heart. You are not awake for the procedure. Please see the instruction sheet given to you today. For further information please visit [email or web address].  Follow-Up: At [facility], you and your health needs are our priority.  As part of our continuing mission to provide you with exceptional heart care, we have created designated Provider Care Teams.  These Care Teams include your primary Cardiologist (physician) and Advanced Practice Providers (APPs -  Physician Assistants and Nurse Practitioners) who all work together to provide you with the care you need, when you need it. . You will need a follow up appointment in 1 months with Quay Burow, MD  Any Other Special Instructions Will Be Listed Below (If Applicable).   Dear  Maria Livingston,  You are scheduled for a TEE/Cardioversion/TEE Cardioversion on Thursday with Dr. Harrell Gave.  Please arrive at the [facility] (Main Entrance A) at [facility]: [address] [place], [place] [address] at 1:30PM. (1 hour prior to procedure unless lab work is needed; if lab work is needed arrive 1.5 hours ahead)  DIET: Nothing to eat or drink after midnight except a sip of water with medications (see medication instructions below)   Labs: If patient is on Coumadin, patient needs pt/INR, CBC, BMET within 3 days (No pt/INR needed for patients taking Xarelto, Eliquis, Pradaxa) For patients receiving anesthesia for TEE and all Cardioversion patients: BMET, CBC within 1 week  Come to: [facility] today to have labs drawn  You must have a responsible person to drive you home and stay in the waiting area during your procedure. Failure to do so could result in cancellation.  Bring your insurance cards.  Special Note: Every effort is made to have your procedure done on time. Occasionally there are emergencies that occur at the hospital that may cause delays. Please be patient if a delay does occur.

## 2018-08-09 NOTE — Progress Notes (Signed)
Cardiology Clinic Note   Patient Name: Maria Livingston Date of Encounter: 08/09/2018  Primary Care Provider:  Lois Huxley, PA Primary Cardiologist:  Quay Burow, MD  Patient Profile    Maria Livingston 73 year old female presents today for review of her abnormal echocardiogram.  Past Medical History    Past Medical History:  Diagnosis Date  . Estrogen deficiency   . Hypertension   . Hypothyroid   . Seizure disorder ([place]) 11/29/2017  . Seizures ([place])   . Vision abnormalities    Past Surgical History:  Procedure Laterality Date  . ABDOMINAL HYSTERECTOMY    . BREAST CYST EXCISION Bilateral 1970  . CYSTECTOMY     bilateral breast  . THYROID SURGERY    . TOE SURGERY    . TONSILLECTOMY      Allergies  No Known Allergies  History of Present Illness    Maria Livingston was last seen by Dr. Gwenlyn Found on 07/25/2018.  She was being evaluated for palpitations per request from Dr. Vanita Panda.  She had known hypertension, hyper thyroidism, former smoker, and seizure disorder.  She had previously seen Dr. Meda Coffee for palpitations on 10/26/2017 this record did not appear in the system.  At that time a 48-hour Holter monitor was ordered and showed sinus bradycardia to sinus tachycardia.  Rare PVCs and infrequent PACs.  No pauses, runs or significant arrhythmias were noted.  No further cardiac evaluation was conducted.  Maria Livingston had presented to the emergency room with night sweats and palpitations around 7/6 on two separate occasions, was evaluated and discharged, was feeling better.  No clear triggers identified.  She denied shortness of breath, chest pain, nausea or vomiting with both bouts.  She presents to the clinic today after having an abnormal echocardiogram on 08/03/2018.  It showed a small mobile mass or tumor on the LVOT wall of the left ventricle.  However, the small pedunculated calcified mobile mass seen in the LVOT that was nonobstructive was not seen in other views and may be  artifact.  The echocardiogram also showed left atrial moderate dilation.  LVEF 60 to 65% and all other anatomy grossly normal.  Today in clinic she states she is still having palpitations daily for several minutes at a time and is wearing her zio patch.  She denies chest pain, shortness of breath, lower extremity edema, presyncope, syncope, fatigue, weakness, melena, hematuria, hemoptysis, orthopnea and PND.    Home Medications    Prior to Admission medications   Medication Sig Start Date End Date Taking? Authorizing Provider  aspirin EC 81 MG tablet Take 81 mg by mouth daily.    [provider]  levETIRAcetam (KEPPRA) 500 MG tablet Take 1 tablet (500 mg total) by mouth every 12 (twelve) hours. 08/02/18   Kathrynn Ducking, MD  levothyroxine (SYNTHROID, LEVOTHROID) 50 MCG tablet Take 25 mcg by mouth daily.     [provider]  losartan-hydrochlorothiazide (HYZAAR) 50-12.5 MG per tablet Take 1 tablet by mouth daily.    [provider]  mirtazapine (REMERON) 15 MG tablet Take 15 mg by mouth at bedtime.    [provider]    Family History    Family History  Problem Relation Age of Onset  . Osteoarthritis Mother   . Hypertension Mother   . Hypertension Father   . Stroke Father   . Hypertension Brother   . Prostate cancer Brother    She indicated that her mother is deceased. She indicated that her father is deceased.  She indicated that her sister is alive. She indicated that both of her brothers are alive.  Social History    Social History   Socioeconomic History  . Marital status: Single    Spouse name: Not on file  . Number of children: Not on file  . Years of education: Not on file  . Highest education level: Not on file  Occupational History  . Not on file  Social Needs  . Financial resource strain: Not on file  . Food insecurity    Worry: Not on file    Inability: Not on file  . Transportation needs    Medical: Not on file     Non-medical: Not on file  Tobacco Use  . Smoking status: Former Smoker    Packs/day: 1.00    Types: Cigarettes    Quit date: 07/02/2004    Years since quitting: 14.1  . Smokeless tobacco: Never Used  Substance and Sexual Activity  . Alcohol use: Yes    Alcohol/week: 0.0 standard drinks    Comment: 1 glass of wine per day  . Drug use: No  . Sexual activity: Not Currently  Lifestyle  . Physical activity    Days per week: Not on file    Minutes per session: Not on file  . Stress: Not on file  Relationships  . Social Herbalist on phone: Not on file    Gets together: Not on file    Attends religious service: Not on file    Active member of club or organization: Not on file    Attends meetings of clubs or organizations: Not on file    Relationship status: Not on file  . Intimate partner violence    Fear of current or ex partner: Not on file    Emotionally abused: Not on file    Physically abused: Not on file    Forced sexual activity: Not on file  Other Topics Concern  . Not on file  Social History Narrative  . Not on file     Review of Systems    General:  No chills, fever, night sweats or weight changes.  Cardiovascular:  No chest pain, dyspnea on exertion, edema, orthopnea, palpitations, paroxysmal nocturnal dyspnea. Dermatological: No rash, goiter right lateral neck Respiratory: No cough, dyspnea Urologic: No hematuria, dysuria Abdominal:   No nausea, vomiting, diarrhea, bright red blood per rectum, melena, or hematemesis Neurologic:  No visual changes, wkns, changes in mental status. All other systems reviewed and are otherwise negative except as noted above.  Physical Exam    VS:  BP 132/84 (BP Location: Left Arm, Patient Position: Sitting, Cuff Size: Normal)   Pulse 90   Temp 98.4 F (36.9 C)   Ht 5\' 4"  (1.626 m)   Wt 126 lb (57.2 kg)   BMI 21.63 kg/m  , BMI Body mass index is 21.63 kg/m. GEN: Well nourished, well developed, in no acute distress.  HEENT: normal. Neck: Supple, no JVD, carotid bruits, or masses. Cardiac: RRR, no murmurs, rubs, or gallops. No clubbing, cyanosis, edema.  Radials/DP/PT 2+ and equal bilaterally.  Respiratory:  Respirations regular and unlabored, clear to auscultation bilaterally. GI: Soft, nontender, nondistended, BS + x 4. MS: no deformity or atrophy. Skin: warm and dry, no rash. Neuro:  Strength and sensation are intact. Psych: Normal affect.  Accessory Clinical Findings    ECG personally reviewed by me today- none today - No acute changes  Echocardiogram 08/03/2018: 1. Small mobile mass  or tumor on the LVOT wall of the left ventricle.  2. The left ventricle has normal systolic function with an ejection fraction of 60-65%. The cavity size was normal. Left ventricular diastolic parameters were normal. No evidence of left ventricular regional wall motion abnormalities.  3. The right ventricle has normal systolic function. The cavity was normal. There is no increase in right ventricular wall thickness.  4. Left atrial size was moderately dilated.  5. The mitral valve is grossly normal.  6. The tricuspid valve is grossly normal.  7. The aortic valve is tricuspid. No stenosis of the aortic valve.  8. The ascending aorta and aortic root are normal in size and structure.  9. The inferior vena cava was normal in size with <50% respiratory variability.  Assessment & Plan   1.  Abnormal echocardiogram- 7/17/20201. Small mobile mass or tumor on the LVOT wall of the left ventricle. Follow-up with TEE- verified with Dr. [place] DOD.  -Explained risks of procedure including anesthesia risk and injury during procedure.  Ms. Evelene Croon expressed understanding, no further questions at this time  2.  Palpitations- patient has had to emergency department visits relating to palpitations at night sweats.  Currently wearing Zio patch.  Labs reviewed and normal. Continue wearing 14-day ZIO patch/monitor  3.  Essential  hypertension-well controlled today(132/84) Continue Hyzaar 50-125 mg tablet daily Increase physical activity as tolerated Low-sodium heart healthy diet  Disposition: Follow-up with Dr. Gwenlyn Found in 1 month  Deberah Pelton, NP 08/09/2018, 11:16 AM

## 2018-08-09 NOTE — H&P (View-Only) (Signed)
Cardiology Clinic Note   Patient Name: Maria Livingston Date of Encounter: 08/09/2018  Primary Care Provider:  Lois Huxley, PA Primary Cardiologist:  Quay Burow, MD  Patient Profile    Maria Livingston 73 year old female presents today for review of her abnormal echocardiogram.  Past Medical History    Past Medical History:  Diagnosis Date  . Estrogen deficiency   . Hypertension   . Hypothyroid   . Seizure disorder ([place]) 11/29/2017  . Seizures ([place])   . Vision abnormalities    Past Surgical History:  Procedure Laterality Date  . ABDOMINAL HYSTERECTOMY    . BREAST CYST EXCISION Bilateral 1970  . CYSTECTOMY     bilateral breast  . THYROID SURGERY    . TOE SURGERY    . TONSILLECTOMY      Allergies  No Known Allergies  History of Present Illness    Maria Livingston was last seen by Dr. Gwenlyn Found on 07/25/2018.  She was being evaluated for palpitations per request from Dr. Vanita Panda.  She had known hypertension, hyper thyroidism, former smoker, and seizure disorder.  She had previously seen Dr. Meda Coffee for palpitations on 10/26/2017 this record did not appear in the system.  At that time a 48-hour Holter monitor was ordered and showed sinus bradycardia to sinus tachycardia.  Rare PVCs and infrequent PACs.  No pauses, runs or significant arrhythmias were noted.  No further cardiac evaluation was conducted.  Maria Livingston had presented to the emergency room with night sweats and palpitations around 7/6 on two separate occasions, was evaluated and discharged, was feeling better.  No clear triggers identified.  She denied shortness of breath, chest pain, nausea or vomiting with both bouts.  She presents to the clinic today after having an abnormal echocardiogram on 08/03/2018.  It showed a small mobile mass or tumor on the LVOT wall of the left ventricle.  However, the small pedunculated calcified mobile mass seen in the LVOT that was nonobstructive was not seen in other views and may be  artifact.  The echocardiogram also showed left atrial moderate dilation.  LVEF 60 to 65% and all other anatomy grossly normal.  Today in clinic she states she is still having palpitations daily for several minutes at a time and is wearing her zio patch.  She denies chest pain, shortness of breath, lower extremity edema, presyncope, syncope, fatigue, weakness, melena, hematuria, hemoptysis, orthopnea and PND.    Home Medications    Prior to Admission medications   Medication Sig Start Date End Date Taking? Authorizing Provider  aspirin EC 81 MG tablet Take 81 mg by mouth daily.    [provider]  levETIRAcetam (KEPPRA) 500 MG tablet Take 1 tablet (500 mg total) by mouth every 12 (twelve) hours. 08/02/18   Kathrynn Ducking, MD  levothyroxine (SYNTHROID, LEVOTHROID) 50 MCG tablet Take 25 mcg by mouth daily.     [provider]  losartan-hydrochlorothiazide (HYZAAR) 50-12.5 MG per tablet Take 1 tablet by mouth daily.    [provider]  mirtazapine (REMERON) 15 MG tablet Take 15 mg by mouth at bedtime.    [provider]    Family History    Family History  Problem Relation Age of Onset  . Osteoarthritis Mother   . Hypertension Mother   . Hypertension Father   . Stroke Father   . Hypertension Brother   . Prostate cancer Brother    She indicated that her mother is deceased. She indicated that her father is deceased.  She indicated that her sister is alive. She indicated that both of her brothers are alive.  Social History    Social History   Socioeconomic History  . Marital status: Single    Spouse name: Not on file  . Number of children: Not on file  . Years of education: Not on file  . Highest education level: Not on file  Occupational History  . Not on file  Social Needs  . Financial resource strain: Not on file  . Food insecurity    Worry: Not on file    Inability: Not on file  . Transportation needs    Medical: Not on file     Non-medical: Not on file  Tobacco Use  . Smoking status: Former Smoker    Packs/day: 1.00    Types: Cigarettes    Quit date: 07/02/2004    Years since quitting: 14.1  . Smokeless tobacco: Never Used  Substance and Sexual Activity  . Alcohol use: Yes    Alcohol/week: 0.0 standard drinks    Comment: 1 glass of wine per day  . Drug use: No  . Sexual activity: Not Currently  Lifestyle  . Physical activity    Days per week: Not on file    Minutes per session: Not on file  . Stress: Not on file  Relationships  . Social Herbalist on phone: Not on file    Gets together: Not on file    Attends religious service: Not on file    Active member of club or organization: Not on file    Attends meetings of clubs or organizations: Not on file    Relationship status: Not on file  . Intimate partner violence    Fear of current or ex partner: Not on file    Emotionally abused: Not on file    Physically abused: Not on file    Forced sexual activity: Not on file  Other Topics Concern  . Not on file  Social History Narrative  . Not on file     Review of Systems    General:  No chills, fever, night sweats or weight changes.  Cardiovascular:  No chest pain, dyspnea on exertion, edema, orthopnea, palpitations, paroxysmal nocturnal dyspnea. Dermatological: No rash, goiter right lateral neck Respiratory: No cough, dyspnea Urologic: No hematuria, dysuria Abdominal:   No nausea, vomiting, diarrhea, bright red blood per rectum, melena, or hematemesis Neurologic:  No visual changes, wkns, changes in mental status. All other systems reviewed and are otherwise negative except as noted above.  Physical Exam    VS:  BP 132/84 (BP Location: Left Arm, Patient Position: Sitting, Cuff Size: Normal)   Pulse 90   Temp 98.4 F (36.9 C)   Ht 5\' 4"  (1.626 m)   Wt 126 lb (57.2 kg)   BMI 21.63 kg/m  , BMI Body mass index is 21.63 kg/m. GEN: Well nourished, well developed, in no acute distress.  HEENT: normal. Neck: Supple, no JVD, carotid bruits, or masses. Cardiac: RRR, no murmurs, rubs, or gallops. No clubbing, cyanosis, edema.  Radials/DP/PT 2+ and equal bilaterally.  Respiratory:  Respirations regular and unlabored, clear to auscultation bilaterally. GI: Soft, nontender, nondistended, BS + x 4. MS: no deformity or atrophy. Skin: warm and dry, no rash. Neuro:  Strength and sensation are intact. Psych: Normal affect.  Accessory Clinical Findings    ECG personally reviewed by me today- none today - No acute changes  Echocardiogram 08/03/2018: 1. Small mobile mass  or tumor on the LVOT wall of the left ventricle.  2. The left ventricle has normal systolic function with an ejection fraction of 60-65%. The cavity size was normal. Left ventricular diastolic parameters were normal. No evidence of left ventricular regional wall motion abnormalities.  3. The right ventricle has normal systolic function. The cavity was normal. There is no increase in right ventricular wall thickness.  4. Left atrial size was moderately dilated.  5. The mitral valve is grossly normal.  6. The tricuspid valve is grossly normal.  7. The aortic valve is tricuspid. No stenosis of the aortic valve.  8. The ascending aorta and aortic root are normal in size and structure.  9. The inferior vena cava was normal in size with <50% respiratory variability.  Assessment & Plan   1.  Abnormal echocardiogram- 7/17/20201. Small mobile mass or tumor on the LVOT wall of the left ventricle. Follow-up with TEE- verified with Dr. [place] DOD.  -Explained risks of procedure including anesthesia risk and injury during procedure.  Maria Livingston expressed understanding, no further questions at this time  2.  Palpitations- patient has had to emergency department visits relating to palpitations at night sweats.  Currently wearing Zio patch.  Labs reviewed and normal. Continue wearing 14-day ZIO patch/monitor  3.  Essential  hypertension-well controlled today(132/84) Continue Hyzaar 50-125 mg tablet daily Increase physical activity as tolerated Low-sodium heart healthy diet  Disposition: Follow-up with Dr. Gwenlyn Found in 1 month  Deberah Pelton, NP 08/09/2018, 11:16 AM

## 2018-08-13 ENCOUNTER — Other Ambulatory Visit (HOSPITAL_COMMUNITY)
Admission: RE | Admit: 2018-08-13 | Discharge: 2018-08-13 | Disposition: A | Discharge status: Home / Self Care | Payer: Medicare Other | Source: Ambulatory Visit | Attending: Cardiovascular Disease | Admitting: Cardiovascular Disease

## 2018-08-13 DIAGNOSIS — Z20828 Contact with and (suspected) exposure to other viral communicable diseases: Secondary | ICD-10-CM | POA: Diagnosis not present

## 2018-08-14 LAB — SARS CORONAVIRUS 2 (TAT 6-24 HRS): SARS Coronavirus 2: NEGATIVE

## 2018-08-14 NOTE — Progress Notes (Signed)
The patient has been notified of the result and verbalized understanding.  All questions (if any) were answered. Jacqulynn Cadet, [place] 08/14/2018 11:03 AM

## 2018-08-15 NOTE — Progress Notes (Signed)
Attempted to call pt two times for prescreening. PT phone went to VM

## 2018-08-16 ENCOUNTER — Encounter (HOSPITAL_COMMUNITY)
Admission: RE | Disposition: A | Discharge status: Home / Self Care | Payer: Medicare Other | Source: Home / Self Care | Attending: Cardiology

## 2018-08-16 ENCOUNTER — Ambulatory Visit (HOSPITAL_COMMUNITY)
Admission: RE | Admit: 2018-08-16 | Discharge: 2018-08-16 | Disposition: A | Discharge status: Home / Self Care | Payer: Medicare Other | Attending: Cardiology | Admitting: Cardiology

## 2018-08-16 ENCOUNTER — Encounter (HOSPITAL_COMMUNITY): Payer: Self-pay

## 2018-08-16 ENCOUNTER — Ambulatory Visit (HOSPITAL_BASED_OUTPATIENT_CLINIC_OR_DEPARTMENT_OTHER): Payer: Medicare Other

## 2018-08-16 ENCOUNTER — Other Ambulatory Visit: Payer: Self-pay

## 2018-08-16 DIAGNOSIS — R61 Generalized hyperhidrosis: Secondary | ICD-10-CM | POA: Insufficient documentation

## 2018-08-16 DIAGNOSIS — Z87891 Personal history of nicotine dependence: Secondary | ICD-10-CM | POA: Diagnosis not present

## 2018-08-16 DIAGNOSIS — R222 Localized swelling, mass and lump, trunk: Secondary | ICD-10-CM | POA: Diagnosis not present

## 2018-08-16 DIAGNOSIS — Z90710 Acquired absence of both cervix and uterus: Secondary | ICD-10-CM | POA: Insufficient documentation

## 2018-08-16 DIAGNOSIS — Z823 Family history of stroke: Secondary | ICD-10-CM | POA: Diagnosis not present

## 2018-08-16 DIAGNOSIS — I700 Atherosclerosis of aorta: Secondary | ICD-10-CM | POA: Diagnosis not present

## 2018-08-16 DIAGNOSIS — E039 Hypothyroidism, unspecified: Secondary | ICD-10-CM | POA: Diagnosis not present

## 2018-08-16 DIAGNOSIS — Z8249 Family history of ischemic heart disease and other diseases of the circulatory system: Secondary | ICD-10-CM | POA: Insufficient documentation

## 2018-08-16 DIAGNOSIS — Z7982 Long term (current) use of aspirin: Secondary | ICD-10-CM | POA: Diagnosis not present

## 2018-08-16 DIAGNOSIS — Z79890 Hormone replacement therapy (postmenopausal): Secondary | ICD-10-CM | POA: Diagnosis not present

## 2018-08-16 DIAGNOSIS — R9430 Abnormal result of cardiovascular function study, unspecified: Secondary | ICD-10-CM | POA: Diagnosis not present

## 2018-08-16 DIAGNOSIS — G40909 Epilepsy, unspecified, not intractable, without status epilepticus: Secondary | ICD-10-CM | POA: Diagnosis not present

## 2018-08-16 DIAGNOSIS — R002 Palpitations: Secondary | ICD-10-CM | POA: Diagnosis not present

## 2018-08-16 DIAGNOSIS — I348 Other nonrheumatic mitral valve disorders: Secondary | ICD-10-CM | POA: Diagnosis not present

## 2018-08-16 DIAGNOSIS — I10 Essential (primary) hypertension: Secondary | ICD-10-CM | POA: Diagnosis not present

## 2018-08-16 DIAGNOSIS — Z79899 Other long term (current) drug therapy: Secondary | ICD-10-CM | POA: Insufficient documentation

## 2018-08-16 DIAGNOSIS — R931 Abnormal findings on diagnostic imaging of heart and coronary circulation: Secondary | ICD-10-CM

## 2018-08-16 HISTORY — PX: TEE WITHOUT CARDIOVERSION: SHX5443

## 2018-08-16 SURGERY — ECHOCARDIOGRAM, TRANSESOPHAGEAL
Anesthesia: Moderate Sedation

## 2018-08-16 MED ORDER — BUTAMBEN-TETRACAINE-BENZOCAINE 2-2-14 % EX AERO
INHALATION_SPRAY | CUTANEOUS | Status: DC | PRN
  Administered 2018-08-16: 2 via TOPICAL

## 2018-08-16 MED ORDER — FENTANYL CITRATE (PF) 100 MCG/2ML IJ SOLN
INTRAMUSCULAR | Status: AC
  Filled 2018-08-16: qty 2

## 2018-08-16 MED ORDER — FENTANYL CITRATE (PF) 100 MCG/2ML IJ SOLN
INTRAMUSCULAR | Status: DC | PRN
  Administered 2018-08-16 (×3): 25 ug via INTRAVENOUS

## 2018-08-16 MED ORDER — MIDAZOLAM HCL (PF) 5 MG/ML IJ SOLN
INTRAMUSCULAR | Status: AC
  Filled 2018-08-16: qty 2

## 2018-08-16 MED ORDER — SODIUM CHLORIDE 0.9 % IV SOLN
INTRAVENOUS | Status: DC
  Administered 2018-08-16: 14:00:00 via INTRAVENOUS

## 2018-08-16 MED ORDER — DIPHENHYDRAMINE HCL 50 MG/ML IJ SOLN
INTRAMUSCULAR | Status: AC
  Filled 2018-08-16: qty 1

## 2018-08-16 MED ORDER — MIDAZOLAM HCL (PF) 10 MG/2ML IJ SOLN
INTRAMUSCULAR | Status: DC | PRN
  Administered 2018-08-16 (×3): 1 mg via INTRAVENOUS
  Administered 2018-08-16: 2 mg via INTRAVENOUS
  Administered 2018-08-16: 1 mg via INTRAVENOUS
  Administered 2018-08-16: 2 mg via INTRAVENOUS

## 2018-08-16 NOTE — Discharge Instructions (Signed)
Transesophageal Echocardiogram  Transesophageal echocardiogram (TEE) is a test that uses sound waves (echocardiogram) to produce very clear, detailed images of the heart. TEE is done by passing a flexible tube down the esophagus. The heart is located in front of the esophagus. TEE may be done:  To check how well your heart valves are working.  To check for any abnormal growth or tumor  To look for blood clots  To check for infection of the lining of the heart (endocarditis).  To evaluate the dividing wall (septum) of the heart and check for a hole that did not close after birth (patent foramen ovale or atrial septal defect).  To help diagnose a tear in the wall of the blood vessels (aortic dissection).  To look at the heart shape, size, and function. Any changes can be associated with certain conditions, including heart failure, aneurysm, and coronary heart disease, CAD.  During cardiac valve surgery. This allows the surgeon to assess the valve repair before closing the chest.  During a variety of other cardiac procedures to guide positioning of catheters.  To monitor your heart's response to IV fluids or medicine. TEE is usually not a painful procedure. You may feel the probe press against the back of the throat. The probe does not enter the trachea and does not affect your breathing. Tell a health care provider about:  Any allergies you have.  All medicines you are taking, including vitamins, herbs, eye drops, creams, and over-the-counter medicines.  Any problems you or family members have had with anesthetic medicines.  Any blood disorders you have.  Any surgeries you have had.  Any medical conditions you have.  Any swallowing difficulties.  Whether you have or have had a blockage of the esophagus (esophageal obstruction).  Whether you are pregnant or may be pregnant. What are the risks? Generally, this is a safe procedure. However, problems may occur,  including:  Damage to other structures or organs.  A tear of the esophagus (esophageal rupture).  Irregular heart beat (arrhythmia).  Hoarse voice or difficulty swallowing.  Bleeding (hemorrhage). What happens before the procedure? Staying hydrated Follow instructions from your health care provider about hydration, which may include:  Up to 3 hours before the procedure - you may continue to drink clear liquids, such as water, clear fruit juice, black coffee, and plain tea. Eating and drinking Follow instructions from your health care provider about eating and drinking, which may include:  8 hours before the procedure - stop eating heavy meals or foods such as meat, fried foods, or fatty foods.  6 hours before the procedure - stop eating light meals or foods, such as toast or cereal.  6 hours before the procedure - stop drinking milk or drinks that contain milk.  3 hours before the procedure - stop drinking clear liquids. General instructions  You will need to remove any dentures or dental retainers.  Plan to have someone take you home from the hospital or clinic.  Plan to have a responsible adult care for you for at least 24 hours after you leave the hospital or clinic. This is important.  Ask your health care provider about: ? Changing or stopping your normal medicines. This is important if you take diabetes medicines or blood thinners. ? Taking over-the-counter medicines, vitamins, herbs, and supplements. ? Taking medicines such as aspirin and ibuprofen. These medicines can thin your blood. Do not take these medicines unless your health care provider tells you to take them. What happens during  the procedure?  To reduce your risk of infection: ? Your health care team will wash or sanitize their hands. ? Hair may be removed from the surgical area. ? Your skin will be washed with soap.  An IV will be inserted into one of your veins.  You will be given one or both of the  following: ? A medicine to help you relax (sedative). ? A medicine to be sprayed or gargled in order to numb the back of your throat (local anesthetic).  Your blood pressure, heart rate, and breathing (vital signs) will be monitored during the procedure.  You may be asked to lay on your left side.  A bite block will be placed in your mouth to keep you from biting the tube during the procedure.  The tip of the TEE probe will be placed into the back of your mouth. You will be asked to swallow. This helps to pass the tip of the probe into the esophagus.  Once the tip of the probe is in the correct area, your health care provider will take pictures of the heart.  The probe and bite block will be removed when the procedure is done. The procedure may vary among health care providers and hospitals What happens after the procedure?  Your blood pressure, heart rate, breathing rate, and blood oxygen level will be monitored until the medicines you were given have worn off.  When you first wake up, your throat may feel slightly sore and will probably still feel numb. This will improve slowly over time. You will not be allowed to eat or drink until the numbness has gone away.  Do not drive for 24 hours if you received a sedative. Summary  Transesophageal echocardiogram (TEE) is a test that uses sound waves (echocardiogram) to produce very clear, detailed images of the heart.  TEE is done by passing a flexible tube down the esophagus.  Generally, this is a safe procedure. However, problems may occur, including damage to other structures or organs, bleeding, irregular heart beat, and a hoarse voice or trouble swallowing.  Tell your health care provider about any medicines and medical conditions you may have, as some conditions may increase your risk of complications. This information is not intended to replace advice given to you by your health care provider. Make sure you discuss any questions you  have with your health care provider. Document Released: 03/26/2002 Document Revised: 06/14/2016 Document Reviewed: 04/01/2016 Elsevier Patient Education  [place].

## 2018-08-16 NOTE — CV Procedure (Signed)
    TRANSESOPHAGEAL ECHOCARDIOGRAM   NAME:  Maria Livingston   MRN: [id number] DOB:  [birth year]   ADMIT DATE: 08/16/2018  INDICATIONS: Concern for LV mass  PROCEDURE:   Informed consent was obtained prior to the procedure. The risks, benefits and alternatives for the procedure were discussed and the patient comprehended these risks.  Risks include, but are not limited to, cough, sore throat, vomiting, nausea, somnolence, esophageal and stomach trauma or perforation, bleeding, low blood pressure, aspiration, pneumonia, infection, trauma to the teeth and death.    Procedural time out performed. The oropharynx was anesthetized with topical 1% lidocaine.    During this procedure the patient is administered a total of Versed 8 mg and Fentanyl 75 mcg to achieve and maintain moderate conscious sedation.  The patient's heart rate, blood pressure, and oxygen saturation are monitored continuously during the procedure. The period of conscious sedation is 27 minutes, of which I was present face-to-face 100% of this time.   The transesophageal probe was inserted in the esophagus and stomach without difficulty and multiple views were obtained.    COMPLICATIONS:    There were no immediate complications.  FINDINGS:  LEFT VENTRICLE: EF = 60-65%. No regional wall motion abnormalities.  RIGHT VENTRICLE: Normal size and function.   LEFT ATRIUM: No thrombus/mass.  LEFT ATRIAL APPENDAGE: No thrombus/mass.   RIGHT ATRIUM: No thrombus/mass.  AORTIC VALVE:  Trileaflet. No regurgitation. No vegetation.  MITRAL VALVE:    Normal structure. Trivial regurgitation. No vegetation. The calcified, pedunculated mass seen on chest wall echo appears to be a calcified chordae tendinae. This was seen in multiple views, and in transgastric views can clearly be seen between the papillary muscle and mitral valve. Does not appear attached to LV surface.   TRICUSPID VALVE: Normal structure. Trivial regurgitation. No  vegetation.  PULMONIC VALVE: Grossly normal structure. Trivial regurgitation. No apparent vegetation.  INTERATRIAL SEPTUM: No PFO or ASD seen by color Doppler.  PERICARDIUM: No effusion noted.  DESCENDING AORTA: Moderate diffuse plaque seen with focal atheromas noted.   CONCLUSION: Calcified mass seen on TTE appears to be calcified chordae tendinae, not LV mass.  Buford Dresser, MD, PhD [facility]  [address], [place], [place] [address] [phone]   2:52 PM

## 2018-08-16 NOTE — Interval H&P Note (Signed)
History and Physical Interval Note:  08/16/2018 2:05 PM  Maria Livingston  has presented today for surgery, with the diagnosis of A-FIB.  The various methods of treatment have been discussed with the patient and family. After consideration of risks, benefits and other options for treatment, the patient has consented to  Procedure(s): TRANSESOPHAGEAL ECHOCARDIOGRAM (TEE) (N/A) as a surgical intervention.  The patient's history has been reviewed, patient examined, no change in status, stable for surgery.  I have reviewed the patient's chart and labs.  Questions were answered to the patient's satisfaction.     Ambriella Kitt Harrell Gave

## 2018-08-17 ENCOUNTER — Encounter (HOSPITAL_COMMUNITY): Payer: Self-pay | Admitting: Cardiology

## 2018-08-21 ENCOUNTER — Ambulatory Visit (INDEPENDENT_AMBULATORY_CARE_PROVIDER_SITE_OTHER): Payer: Medicare Other | Admitting: Adult Health

## 2018-08-21 ENCOUNTER — Encounter: Payer: Self-pay | Admitting: Adult Health

## 2018-08-21 ENCOUNTER — Other Ambulatory Visit: Payer: Self-pay

## 2018-08-21 VITALS — BP 156/87 | HR 80 | Ht 64.0 in | Wt 129.4 lb

## 2018-08-21 DIAGNOSIS — Z79899 Other long term (current) drug therapy: Secondary | ICD-10-CM

## 2018-08-21 DIAGNOSIS — F418 Other specified anxiety disorders: Secondary | ICD-10-CM

## 2018-08-21 DIAGNOSIS — E0500 Thyrotoxicosis with diffuse goiter without thyrotoxic crisis or storm: Secondary | ICD-10-CM | POA: Diagnosis not present

## 2018-08-21 DIAGNOSIS — E559 Vitamin D deficiency, unspecified: Secondary | ICD-10-CM | POA: Diagnosis not present

## 2018-08-21 DIAGNOSIS — R002 Palpitations: Secondary | ICD-10-CM | POA: Diagnosis not present

## 2018-08-21 DIAGNOSIS — E039 Hypothyroidism, unspecified: Secondary | ICD-10-CM | POA: Diagnosis not present

## 2018-08-21 MED ORDER — POTASSIUM CHLORIDE ER 10 MEQ PO TBCR: 10.0000 meq | EXTENDED_RELEASE_TABLET | Freq: Every day | ORAL | 3 refills | Status: DC

## 2018-08-21 MED ORDER — METOPROLOL TARTRATE 25 MG PO TABS: 12.5000 mg | ORAL_TABLET | Freq: Two times a day (BID) | ORAL | 3 refills | Status: DC

## 2018-08-21 NOTE — Progress Notes (Signed)
Cardiology Office Note   Date:  08/21/2018   ID:  Maria Livingston, DOB 03/06/1945, MRN [id number]  PCP:  Lois Huxley, PA  Cardiologist:  Dr. Gwenlyn Found  CC: Rapid Heart Rate    History of Present Illness: Maria Livingston is a 73 y.o. female who presents as a walk-in due complaints of rapid HR, not able to sleep the night before because of this, significant anxiety about her symptoms, asking to be seen today. She has a Zio cardiac monitor which was placed in early July for evaluation of her symptoms of rapid HR. She took it off yesterday to send back.   She has history of abnormal echocardiogram on 08/03/2018 which demonstrated a small mobile mass or tumor on the LVOT of th left ventricle. Normal EF. Follow up TEE was performed by Dr. Harrell Gave on 08/16/2018. This revealed a calcified chordae tendinae and not an LV mass.   Other history includes hyperthyroidism, bilateral thyroid goiters, and hypertension, with anxiety. She is very anxious today. She states that the heart racing is coming and going. This occurred while she was wearing the monitor and also since taking it off.  She states she is exhausted from not sleeping last night.   Past Medical History:  Diagnosis Date  . Estrogen deficiency   . Hypertension   . Hypothyroid   . Seizure disorder ([place]) 11/29/2017  . Seizures ([place])   . Vision abnormalities     Past Surgical History:  Procedure Laterality Date  . ABDOMINAL HYSTERECTOMY    . BREAST CYST EXCISION Bilateral 1970  . CYSTECTOMY     bilateral breast  . TEE WITHOUT CARDIOVERSION N/A 08/16/2018   Procedure: TRANSESOPHAGEAL ECHOCARDIOGRAM (TEE);  Surgeon: Buford Dresser, MD;  Location: [facility] ENDOSCOPY;  Service: Cardiovascular;  Laterality: N/A;  . THYROID SURGERY    . TOE SURGERY    . TONSILLECTOMY       Current Outpatient Medications  Medication Sig Dispense Refill  . aspirin EC 81 MG tablet Take 81 mg by mouth daily.    . hydrochlorothiazide (HYDRODIURIL) 12.5 MG  tablet Take 12.5 mg by mouth daily.    Marland Kitchen levETIRAcetam (KEPPRA) 500 MG tablet Take 1 tablet (500 mg total) by mouth every 12 (twelve) hours. 60 tablet 3  . levothyroxine (SYNTHROID) 75 MCG tablet Take 37.5 mcg by mouth daily before breakfast.    . losartan (COZAAR) 50 MG tablet Take 50 mg by mouth daily.    . mirtazapine (REMERON) 15 MG tablet Take 15 mg by mouth at bedtime.    Vladimir Faster Glycol-Propyl Glycol (LUBRICANT EYE DROPS) 0.4-0.3 % SOLN Place 1 drop into both eyes 3 (three) times daily as needed (dry/irritated eyes.).     No current facility-administered medications for this visit.     Allergies:   Patient has no known allergies.    Social History:  The patient  reports that she quit smoking about 14 years ago. Her smoking use included cigarettes. She smoked 1.00 pack per day. She has never used smokeless tobacco. She reports current alcohol use. She reports that she does not use drugs.   Family History:  The patient's family history includes Hypertension in her brother, father, and mother; Osteoarthritis in her mother; Prostate cancer in her brother; Stroke in her father.    ROS: All other systems are reviewed and negative. Unless otherwise mentioned in H&P    PHYSICAL EXAM: VS:  BP (!) 156/87   Pulse 80   Ht 5\' 4"  (1.626 m)  Wt 129 lb 6.4 oz (58.7 kg)   BMI 22.21 kg/m  , BMI Body mass index is 22.21 kg/m. GEN: Well nourished, well developed, in no acute distress HEENT: Bilateral thyroid goiters.  Neck: no JVD, carotid bruits, or masses Cardiac: RRR, tachycardic; no murmurs, rubs, or gallops,no edema  Respiratory:  Clear to auscultation bilaterally, normal work of breathing GI: soft, nontender, nondistended, + BS MS: no deformity or atrophy Skin: warm and dry, no rash Neuro:  Strength and sensation are intact Psych: euthymic mood, full affect   EKG:  Sinus rhythm with rate of 80 bpm, LVH with repolarization abnormality.   Recent Labs: 07/23/2018: Magnesium 1.8  07/25/2018: ALT 16; TSH 0.994 08/09/2018: BUN 8; Creatinine, Ser 0.83; Hemoglobin 10.8; Platelets 267; Potassium 3.6; Sodium 144    Lipid Panel No results found for: CHOL, TRIG, HDL, CHOLHDL, VLDL, LDLCALC, LDLDIRECT    Wt Readings from Last 3 Encounters:  08/21/18 129 lb 6.4 oz (58.7 kg)  08/09/18 126 lb (57.2 kg)  07/25/18 126 lb (57.2 kg)      Other studies Reviewed: Echocardiogram 2018/08/24 1. Small mobile mass or tumor on the LVOT wall of the left ventricle.  2. The left ventricle has normal systolic function with an ejection fraction of 60-65%. The cavity size was normal. Left ventricular diastolic parameters were normal. No evidence of left ventricular regional wall motion abnormalities.  3. The right ventricle has normal systolic function. The cavity was normal. There is no increase in right ventricular wall thickness.  4. Left atrial size was moderately dilated.  5. The mitral valve is grossly normal.  6. The tricuspid valve is grossly normal.  7. The aortic valve is tricuspid. No stenosis of the aortic valve.  8. The ascending aorta and aortic root are normal in size and structure.  9. The inferior vena cava was normal in size with <50% respiratory variability.  Echocardiogram 08/16/2018  1. The left ventricle has normal systolic function, with an ejection fraction of 60-65%. The cavity size was normal. No evidence of left ventricular regional wall motion abnormalities.  2. The right ventricle has normal systolc function. The cavity was normal. Right ventricular systolic pressure could not be assessed.  3. Left atrial size was not assessed.  4. No evidence of a thrombus present in the left atrial appendage.  5. Mild thickening of the mitral valve leaflet. Mild calcification of the mitral valve leaflet. No mitral valve vegetation visualized.  6. No trisuspid valve vegetation visualized.  7. No vegetation on the aortic valve.  8. The aortic valve is tricuspid.  9. The aortic  root, ascending aorta and descending aorta are normal in size and structure. 10. There is evidence of moderate plaque in the descending aorta.  ASSESSMENT AND PLAN:  1. Palpitations: Causing her to have significant stress, unable to sleep as a result.  Zio monitor just turned in today. EKG reveals sinus tachycardia, no arrhythmias. I will begin her on low dose metoprolol tartrate 12.5 mg BID to help with symptoms and BP control. Will await cardiac monitor report. Check TSH and Vitamin D.   2. Hypertension: BP is elevated today 156/87. She is on HCTZ and losartan. Potassium is low normal. Will start her on low dose Micro K 10 mEq daily. Will follow up with labs to assess her status on ARB.   3. Bilateral thyroid goiters:  She denies breathing restrictions or dysphagia. Last ultrasound in 2017 Recommend repeat study.   4. Anxiety about health:  May need  to have some antianxiety medications if this become incapacitating to her. See PCP for this.     Current medicines are reviewed at length with the patient today.    Labs/ tests ordered today include: Vitamin D level, BMET. TSH  Phill Myron. West Pugh, ANP, AACC   08/21/2018 9:58 AM    [place] [address] Office [phone] Fax [phone]

## 2018-08-21 NOTE — Patient Instructions (Addendum)
Medication Instructions:  START- Metoprolol tartrate 12.5 mg by mouth twice a day START- Potassium 10 meq by mouth daily  If you need a refill on your cardiac medications before your next appointment, please call your pharmacy.  Labwork: BMP, Vit D, TSH HERE IN OUR OFFICE AT [facility]    You will NOT need to fast   Take the provided lab slips with you to the lab for your blood draw.   When you have your labs (blood work) drawn today and your tests are completely normal, you will receive your results only by MyChart Message (if you have MyChart) -OR-  A paper copy in the mail.  If you have any lab test that is abnormal or we need to change your treatment, we will call you to review these results.  Testing/Procedures: None Ordered  Follow-Up: . Keep appointment with Dr Gwenlyn Found August 28th  At [facility], you and your health needs are our priority.  As part of our continuing mission to provide you with exceptional heart care, we have created designated Provider Care Teams.  These Care Teams include your primary Cardiologist (physician) and Advanced Practice Providers (APPs -  Physician Assistants and Nurse Practitioners) who all work together to provide you with the care you need, when you need it.  Thank you for choosing [facility] at [facility]!!

## 2018-08-22 ENCOUNTER — Telehealth: Payer: Self-pay | Admitting: *Deleted

## 2018-08-22 LAB — BASIC METABOLIC PANEL
BUN/Creatinine Ratio: 10 — ABNORMAL LOW (ref 12–28)
BUN: 9 mg/dL (ref 8–27)
CO2: 24 mmol/L (ref 20–29)
Calcium: 10 mg/dL (ref 8.7–10.3)
Chloride: 103 mmol/L (ref 96–106)
Creatinine, Ser: 0.86 mg/dL (ref 0.57–1.00)
GFR calc Af Amer: 79 mL/min/{1.73_m2} (ref >59)
GFR calc non Af Amer: 68 mL/min/{1.73_m2} (ref >59)
Glucose: 78 mg/dL (ref 65–99)
Potassium: 3.4 mmol/L — ABNORMAL LOW (ref 3.5–5.2)
Sodium: 144 mmol/L (ref 134–144)

## 2018-08-22 LAB — VITAMIN D 25 HYDROXY (VIT D DEFICIENCY, FRACTURES): Vit D, 25-Hydroxy: 6.6 ng/mL — ABNORMAL LOW (ref 30.0–100.0)

## 2018-08-22 LAB — TSH: TSH: 1.06 u[IU]/mL (ref 0.450–4.500)

## 2018-08-22 MED ORDER — VITAMIN D (ERGOCALCIFEROL) 1.25 MG (50000 UNIT) PO CAPS: 50000.0000 [IU] | ORAL_CAPSULE | ORAL | 0 refills | Status: DC

## 2018-08-22 NOTE — Telephone Encounter (Signed)
-----   Message from Lendon Colonel, NP sent at 08/22/2018  7:20 AM EDT ----- Vitamin D is very low 6.6, should be 30-100. Marland Kitchen She needs to start on Vitamin D replacement 50,000 IU once a week for 12 weeks. She is to see her PCP for maintenance dosage a soon as she is able to make an appointment. Her potassium was a little low, I put her on potassium replacement on last visit.  Still waiting on the cardiac monitor download to come in.

## 2018-08-22 NOTE — Telephone Encounter (Signed)
Pt is aware of her blood work, Vit D 50,000 unit was send into pt pharmacy.Marland Kitchen

## 2018-08-23 ENCOUNTER — Telehealth: Payer: Self-pay | Admitting: Adult Health

## 2018-08-23 NOTE — Telephone Encounter (Signed)
I just spoke with her by phone yesterday morning concerning her recent labs. She did not mention this to me at that time. She has low Vit D, and hypokalemia. She was started on Vit D replacement and potassium replacement. These supplements should not be causing her symptoms. Still waiting for cardiac monitor results.

## 2018-08-23 NOTE — Telephone Encounter (Signed)
New Message   Patient is calling because last night she was sweating. She was sweating so much that her bed and sheets as well as herself was completely wet. Please call to discuss.

## 2018-08-23 NOTE — Telephone Encounter (Signed)
Called patient, she states she had sweating episodes from 2-3 AM- soaking wet, dripping so much she had to change her clothes, dry off with a towel, and change her sheets.  Patient states that she is continued palpitations when the episodes occur. She states she does not check her BP when the episodes occur, becausec they mostly occur at night time.  She states she has had her thyroid checked recently and it was good.  Patient denies any other cardiac issues- chest pain, shortness of breath. Patient is very concerned that nobody is concerned about this issue for her, she feels that something is being missed.  Patient states she seen her PCP about this issue and they keep sending her to the ED but every time she gets there she is released and told it is nothing. This was mentioned in [facility] note, but just recently she saw Jory Sims on 08/21/2018.  Advised patient I would route to MD, as well as NP since she was just seen by her for any recommendations at this time.

## 2018-08-23 NOTE — Telephone Encounter (Signed)
Called patient and advised of message. Patient verbalized understanding.

## 2018-08-29 DIAGNOSIS — R42 Dizziness and giddiness: Secondary | ICD-10-CM | POA: Diagnosis not present

## 2018-08-29 DIAGNOSIS — R002 Palpitations: Secondary | ICD-10-CM | POA: Diagnosis not present

## 2018-08-30 ENCOUNTER — Other Ambulatory Visit: Payer: Self-pay | Admitting: Medical

## 2018-08-30 DIAGNOSIS — R42 Dizziness and giddiness: Secondary | ICD-10-CM

## 2018-08-30 DIAGNOSIS — R002 Palpitations: Secondary | ICD-10-CM

## 2018-09-03 ENCOUNTER — Encounter: Payer: Self-pay | Admitting: *Deleted

## 2018-09-03 NOTE — Telephone Encounter (Addendum)
-----   Message from Lorretta Harp, MD sent at 09/02/2018 10:55 AM EDT ----- Probably needs to be seen in the office for evaluation of her monitor, nonsustained ventricular tachycardia and palpitations.  I agree with Dr. Meda Coffee suggestion of a beta-blocker however as well.  Left message for pt to call

## 2018-09-07 ENCOUNTER — Telehealth: Payer: Self-pay | Admitting: *Deleted

## 2018-09-07 MED ORDER — METOPROLOL TARTRATE 25 MG PO TABS: 25.0000 mg | ORAL_TABLET | Freq: Two times a day (BID) | ORAL | 3 refills | Status: DC

## 2018-09-07 NOTE — Telephone Encounter (Addendum)
-----   Message from Lorretta Harp, MD sent at 09/02/2018 10:55 AM EDT ----- Probably needs to be seen in the office for evaluation of her monitor, nonsustained ventricular tachycardia and palpitations.  I agree with Dr. Meda Coffee suggestion of a beta-blocker however as well.  Spoke with pt, aware of results. She is currently on lopressor 12.5 mg twice daily. Patient instructed to increase dosage to 25 mg twice daily as she is still having palpitations.

## 2018-09-07 NOTE — Telephone Encounter (Signed)
This encounter was created in error - please disregard.

## 2018-09-14 ENCOUNTER — Other Ambulatory Visit: Payer: Self-pay

## 2018-09-14 ENCOUNTER — Encounter: Payer: Self-pay | Admitting: Cardiovascular Disease

## 2018-09-14 ENCOUNTER — Ambulatory Visit (INDEPENDENT_AMBULATORY_CARE_PROVIDER_SITE_OTHER): Payer: Medicare Other | Admitting: Cardiovascular Disease

## 2018-09-14 DIAGNOSIS — R002 Palpitations: Secondary | ICD-10-CM | POA: Diagnosis not present

## 2018-09-14 DIAGNOSIS — I10 Essential (primary) hypertension: Secondary | ICD-10-CM

## 2018-09-14 DIAGNOSIS — R931 Abnormal findings on diagnostic imaging of heart and coronary circulation: Secondary | ICD-10-CM | POA: Diagnosis not present

## 2018-09-14 NOTE — Progress Notes (Signed)
09/14/2018 Arielle Eber   April 18, 1945  [id number]  Primary Physician Lois Huxley, PA Primary Cardiologist: Lorretta Harp MD Garret Reddish, [place], [place]  HPI:  Maria Livingston is a 73 y.o. thin appearing widowed African-American female mother of 1 son who I met in consultation during hospitalization in July for palpitations and diaphoresis.  She is tired working at [employer] for 20 years and at [employer] for 20 years.  Risk factors include remote tobacco abuse and hypertension.  She drinks wine on a daily basis.  There is no family history of heart disease.  She is never had a heart attack or stroke and denies chest pain or shortness of breath.  A 2-week ZIO patch revealed a run of nonsustained ventricular tachycardia, some PACs and PVCs.  She did drink caffeinated beverages in the past which have been changed to decaf.  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 Harrell Gave.  She is otherwise asymptomatic.   Current Meds  Medication Sig  . aspirin EC 81 MG tablet Take 81 mg by mouth daily.  . hydrochlorothiazide (HYDRODIURIL) 12.5 MG tablet Take 12.5 mg by mouth daily.  Marland Kitchen levETIRAcetam (KEPPRA) 500 MG tablet Take 1 tablet (500 mg total) by mouth every 12 (twelve) hours.  Marland Kitchen levothyroxine (SYNTHROID) 75 MCG tablet Take 37.5 mcg by mouth daily before breakfast.  . losartan (COZAAR) 50 MG tablet Take 50 mg by mouth daily.  . metoprolol tartrate (LOPRESSOR) 25 MG tablet Take 1 tablet (25 mg total) by mouth 2 (two) times daily.  . mirtazapine (REMERON) 15 MG tablet Take 15 mg by mouth at bedtime.  . potassium chloride (K-DUR) 10 MEQ tablet Take 1 tablet (10 mEq total) by mouth daily.  Marland Kitchen VITAMIN D, CHOLECALCIFEROL, PO Take 50 mg by mouth daily.     No Known Allergies  Social History   Socioeconomic History  . Marital status: Single    Spouse name: Not on file  . Number  of children: Not on file  . Years of education: Not on file  . Highest education level: Not on file  Occupational History  . Not on file  Social Needs  . Financial resource strain: Not on file  . Food insecurity    Worry: Not on file    Inability: Not on file  . Transportation needs    Medical: Not on file    Non-medical: Not on file  Tobacco Use  . Smoking status: Former Smoker    Packs/day: 1.00    Types: Cigarettes    Quit date: 07/02/2004    Years since quitting: 14.2  . Smokeless tobacco: Never Used  Substance and Sexual Activity  . Alcohol use: Yes    Alcohol/week: 0.0 standard drinks    Comment: 1 glass of wine per day  . Drug use: No  . Sexual activity: Not Currently  Lifestyle  . Physical activity    Days per week: Not on file    Minutes per session: Not on file  . Stress: Not on file  Relationships  . Social Herbalist on phone: Not on file    Gets together: Not on file    Attends religious service: Not on file    Active member of club or organization: Not on file    Attends meetings of clubs or organizations: Not on file  Relationship status: Not on file  . Intimate partner violence    Fear of current or ex partner: Not on file    Emotionally abused: Not on file    Physically abused: Not on file    Forced sexual activity: Not on file  Other Topics Concern  . Not on file  Social History Narrative  . Not on file     Review of Systems: General: negative for chills, fever, night sweats or weight changes.  Cardiovascular: negative for chest pain, dyspnea on exertion, edema, orthopnea, palpitations, paroxysmal nocturnal dyspnea or shortness of breath Dermatological: negative for rash Respiratory: negative for cough or wheezing Urologic: negative for hematuria Abdominal: negative for nausea, vomiting, diarrhea, bright red blood per rectum, melena, or hematemesis Neurologic: negative for visual changes, syncope, or dizziness All other systems  reviewed and are otherwise negative except as noted above.    Blood pressure 104/60, pulse 63, temperature 97.7 F (36.5 C), height _0  (1.651 m), weight 132 lb (59.9 kg).  General appearance: alert and no distress Neck: no adenopathy, no carotid bruit, no JVD, supple, symmetrical, trachea midline and thyroid not enlarged, symmetric, no tenderness/mass/nodules Lungs: clear to auscultation bilaterally Heart: regular rate and rhythm, S1, S2 normal, no murmur, click, rub or gallop Extremities: extremities normal, atraumatic, no cyanosis or edema Pulses: 2+ and symmetric Skin: Skin color, texture, turgor normal. No rashes or lesions Neurologic: Alert and oriented X 3, normal strength and tone. Normal symmetric reflexes. Normal coordination and gait  EKG not performed today  ASSESSMENT AND PLAN:   HTN (hypertension) History of essential hypertension with blood pressure measured today at 104/60.  She is on metoprolol, hydrochlorothiazide and losartan.  Palpitations History of palpitations with event monitor that showed a run of nonsustained ventricular tachycardia, some PACs and PVCs.  She does drink decaffeinated soft drinks and coffee now.  She was recently placed on low-dose beta-blocker by Jory Sims, NP.  Abnormal echocardiogram Recent abnormal transthoracic echocardiogram reviewed revealing a small mobile mass in the LV OT a follow-up trans-esophageal echo performed by Dr. Harrell Gave revealed this to be a calcified chordae tendonae      Lorretta Harp MD [facility], [facility] 09/14/2018 10:41 AM

## 2018-09-14 NOTE — Assessment & Plan Note (Signed)
History of essential hypertension with blood pressure measured today at 104/60.  She is on metoprolol, hydrochlorothiazide and losartan.

## 2018-09-14 NOTE — Assessment & Plan Note (Signed)
Recent abnormal transthoracic echocardiogram reviewed revealing a small mobile mass in the LV OT a follow-up trans-esophageal echo performed by Dr. 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

## 2018-09-14 NOTE — Patient Instructions (Signed)

## 2018-09-14 NOTE — Assessment & Plan Note (Signed)
History of palpitations with event monitor that showed a run of nonsustained ventricular tachycardia, some PACs and PVCs.  She does drink decaffeinated soft drinks and coffee now.  She was recently placed on low-dose beta-blocker by Jory Sims, NP.

## 2018-11-02 ENCOUNTER — Telehealth: Payer: Self-pay | Admitting: Neurology

## 2018-11-02 MED ORDER — LEVETIRACETAM 500 MG PO TABS: 500.0000 mg | ORAL_TABLET | Freq: Two times a day (BID) | ORAL | 1 refills | Status: DC

## 2018-11-02 NOTE — Telephone Encounter (Signed)
Patient left a message with the after hours call center re: Rx refill, did not specify Rx. I called her back but she did not answer, I left a generic message. In reviewing her chart, she had her last refill of the [place] in July. I went ahead and renewed the Rx, 1 mo w 1 refill, but no appt is pending. Please review and schedule appt as appropriate.

## 2018-11-05 MED ORDER — LEVETIRACETAM 500 MG PO TABS: 500.0000 mg | ORAL_TABLET | Freq: Two times a day (BID) | ORAL | 0 refills | Status: DC

## 2018-11-05 NOTE — Telephone Encounter (Signed)
Pt called stating that her pharmacy informed her that they have not received the refill request for her Keppra and she is wanting to know if it can be recent and also if she can get a few emergency pills called in for her at the [facility] on [address] until her normal pharmacy sends her the refill because she is completely out of her medication. Please advise.

## 2018-11-05 NOTE — Addendum Note (Signed)
Addended by: Verlin Grills T on: 11/05/2018 03:34 PM   Modules accepted: Orders

## 2018-11-05 NOTE — Telephone Encounter (Signed)
I reached out to the pt. I advised I would send a 7 day rx to the [place] [address]. Pt was agreeable and appreciative.  While we were on the phone we also scheduled her 1 year f/u for 12/10/2018 at 945 check in time of 915 am with Maryjean Ka, NP.

## 2018-11-05 NOTE — Telephone Encounter (Signed)
I have placed appointment letter in out going mail at [facility]na asking for pt to call and schedule an appt.

## 2018-11-15 DIAGNOSIS — E876 Hypokalemia: Secondary | ICD-10-CM | POA: Diagnosis not present

## 2018-11-15 DIAGNOSIS — N182 Chronic kidney disease, stage 2 (mild): Secondary | ICD-10-CM | POA: Diagnosis not present

## 2018-11-15 DIAGNOSIS — D649 Anemia, unspecified: Secondary | ICD-10-CM | POA: Diagnosis not present

## 2018-11-15 DIAGNOSIS — Z87898 Personal history of other specified conditions: Secondary | ICD-10-CM | POA: Diagnosis not present

## 2018-11-15 DIAGNOSIS — F324 Major depressive disorder, single episode, in partial remission: Secondary | ICD-10-CM | POA: Diagnosis not present

## 2018-11-15 DIAGNOSIS — F419 Anxiety disorder, unspecified: Secondary | ICD-10-CM | POA: Diagnosis not present

## 2018-11-15 DIAGNOSIS — Z0000 Encounter for general adult medical examination without abnormal findings: Secondary | ICD-10-CM | POA: Diagnosis not present

## 2018-11-15 DIAGNOSIS — Z1389 Encounter for screening for other disorder: Secondary | ICD-10-CM | POA: Diagnosis not present

## 2018-11-15 DIAGNOSIS — E039 Hypothyroidism, unspecified: Secondary | ICD-10-CM | POA: Diagnosis not present

## 2018-11-15 DIAGNOSIS — E041 Nontoxic single thyroid nodule: Secondary | ICD-10-CM | POA: Diagnosis not present

## 2018-11-15 DIAGNOSIS — Z136 Encounter for screening for cardiovascular disorders: Secondary | ICD-10-CM | POA: Diagnosis not present

## 2018-11-15 DIAGNOSIS — R809 Proteinuria, unspecified: Secondary | ICD-10-CM | POA: Diagnosis not present

## 2018-11-15 DIAGNOSIS — I10 Essential (primary) hypertension: Secondary | ICD-10-CM | POA: Diagnosis not present

## 2018-11-19 ENCOUNTER — Other Ambulatory Visit: Payer: Self-pay | Admitting: Family Medicine

## 2018-11-19 DIAGNOSIS — E2839 Other primary ovarian failure: Secondary | ICD-10-CM

## 2018-11-19 DIAGNOSIS — Z1231 Encounter for screening mammogram for malignant neoplasm of breast: Secondary | ICD-10-CM

## 2018-12-05 ENCOUNTER — Ambulatory Visit: Payer: Self-pay | Admitting: Adult Health
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
## 2018-12-10 ENCOUNTER — Ambulatory Visit (INDEPENDENT_AMBULATORY_CARE_PROVIDER_SITE_OTHER): Payer: Medicare Other | Admitting: Neurology

## 2018-12-10 ENCOUNTER — Other Ambulatory Visit: Payer: Self-pay

## 2018-12-10 ENCOUNTER — Encounter: Payer: Self-pay | Admitting: Neurology

## 2018-12-10 VITALS — BP 126/80 | HR 64 | Temp 97.5°F | Ht 64.0 in | Wt 122.0 lb

## 2018-12-10 DIAGNOSIS — G40909 Epilepsy, unspecified, not intractable, without status epilepticus: Secondary | ICD-10-CM | POA: Diagnosis not present

## 2018-12-10 MED ORDER — LEVETIRACETAM 750 MG PO TABS: 750.0000 mg | ORAL_TABLET | Freq: Two times a day (BID) | ORAL | 1 refills | Status: DC

## 2018-12-10 NOTE — Progress Notes (Signed)
I have read the note, and I agree with the clinical assessment and plan.  Sharief Wainwright K Lona Six   

## 2018-12-10 NOTE — Patient Instructions (Signed)
Please increase your Keppra to 750 mg twice a day. If the episodes continue to occur, please let me know.

## 2019-02-26 ENCOUNTER — Telehealth: Payer: Self-pay | Admitting: Neurology

## 2019-02-26 MED ORDER — LEVETIRACETAM 750 MG PO TABS: 750.0000 mg | ORAL_TABLET | Freq: Two times a day (BID) | ORAL | 3 refills | Status: DC

## 2019-02-26 NOTE — Telephone Encounter (Signed)
She is to be taking Keppra 750 mg BID. Dose was increased after last visit, due to possible seizures of staring episodes. Okay I sent.

## 2019-02-26 NOTE — Telephone Encounter (Signed)
I called pt and relayed that her medication 750mg  po bid keppra was sent to [facility]. She verbalized understanding and instructions.

## 2019-02-26 NOTE — Telephone Encounter (Signed)
Pt stopped by requesting that a nurse reaches out to her regarding questions she has about her medication. She's stating she was told one thing here and something else at the pharmacy. Please advise.

## 2019-02-26 NOTE — Telephone Encounter (Signed)
I called pt and she has been taking 1075mg  po bid since 12-28-18.  She was last seen 12-10-18 and was increased to her keppra 750mg  po bid.  She had 500mg  tablets and was taking 1.5tabs po bid and continued to take these when got new bottle of 750mg  tabs.  She is doing ok, no seizures, no slurred speech, feels fine.  Will need refill earlier since taking extra dosing.  Please advise.

## 2019-03-05 ENCOUNTER — Other Ambulatory Visit: Payer: Self-pay | Admitting: Cardiovascular Disease

## 2019-03-05 MED ORDER — METOPROLOL TARTRATE 25 MG PO TABS: 25.0000 mg | ORAL_TABLET | Freq: Two times a day (BID) | ORAL | 1 refills | Status: DC

## 2019-03-05 NOTE — Telephone Encounter (Signed)
New Message   *STAT* If patient is at the pharmacy, call can be transferred to refill team.   1. Which medications need to be refilled? (please list name of each medication and dose if known) metoprolol tartrate (LOPRESSOR) 25 MG tablet(Expired)  2. Which pharmacy/location (including street and city if local pharmacy) is medication to be sent to? [place]  3. Do they need a 30 day or 90 day supply? 90 day   Patient has moved and has lost her medication and needs a refill sent to the pharmacy today. Please assist.

## 2019-03-09 ENCOUNTER — Ambulatory Visit: Payer: Medicare Other | Attending: Internal Medicine

## 2019-03-09 DIAGNOSIS — Z23 Encounter for immunization: Secondary | ICD-10-CM | POA: Insufficient documentation

## 2019-03-09 NOTE — Progress Notes (Signed)
   Covid-19 Vaccination Clinic  Name:  Maria Livingston    MRN: [id number] DOB: 08-31-1945  03/09/2019  Maria Livingston was observed post Covid-19 immunization for 15 minutes without incidence. She was provided with Vaccine Information Sheet and instruction to access the V-Safe system.   Maria Livingston was instructed to call 911 with any severe reactions post vaccine: Marland Kitchen Difficulty breathing  . Swelling of your face and throat  . A fast heartbeat  . A bad rash all over your body  . Dizziness and weakness    Immunizations Administered    Name Date Dose VIS Date Route   Pfizer COVID-19 Vaccine 03/09/2019  2:08 PM 0.3 mL 12/28/2018 Intramuscular   Manufacturer: [place]   Lot: [id number]   [place]: [id number]

## 2019-03-25 ENCOUNTER — Other Ambulatory Visit: Payer: Self-pay | Admitting: Family Medicine

## 2019-03-25 DIAGNOSIS — Z1231 Encounter for screening mammogram for malignant neoplasm of breast: Secondary | ICD-10-CM

## 2019-04-01 ENCOUNTER — Ambulatory Visit: Payer: Medicare Other | Attending: Internal Medicine

## 2019-04-01 DIAGNOSIS — Z23 Encounter for immunization: Secondary | ICD-10-CM

## 2019-04-01 NOTE — Progress Notes (Signed)
   Covid-19 Vaccination Clinic  Name:  Maria Livingston    MRN: [id number] DOB: 07-13-1945  04/01/2019  [name] was observed post Covid-19 immunization for 15 minutes without incident. She was provided with Vaccine Information Sheet and instruction to access the V-Safe system.   [name] was instructed to call 911 with any severe reactions post vaccine: Marland Kitchen Difficulty breathing  . Swelling of face and throat  . A fast heartbeat  . A bad rash all over body  . Dizziness and weakness   Immunizations Administered    Name Date Dose VIS Date Route   Pfizer COVID-19 Vaccine 04/01/2019  1:05 PM 0.3 mL 12/28/2018 Intramuscular   Manufacturer: [place]   Lot: [id number]   [place]: [id number]

## 2019-05-27 ENCOUNTER — Other Ambulatory Visit: Payer: Self-pay | Admitting: Cardiovascular Disease

## 2019-05-27 NOTE — Telephone Encounter (Signed)
*  STAT* If patient is at the pharmacy, call can be transferred to refill team.   1. Which medications need to be refilled? (please list name of each medication and dose if known) potassium chloride (K-DUR) 10 MEQ tablet  2. Which pharmacy/location (including street and city if local pharmacy) is medication to be sent to? [place]  3. Do they need a 30 day or 90 day supply? Patient would like a 6 month supply as it is cheaper.    Patient is out of the medication.

## 2019-05-28 MED ORDER — POTASSIUM CHLORIDE ER 10 MEQ PO TBCR: 10.0000 meq | EXTENDED_RELEASE_TABLET | Freq: Every day | ORAL | 2 refills | Status: AC

## 2019-06-05 ENCOUNTER — Other Ambulatory Visit: Payer: Self-pay | Admitting: Cardiovascular Disease

## 2019-06-05 MED ORDER — METOPROLOL TARTRATE 25 MG PO TABS: 25.0000 mg | ORAL_TABLET | Freq: Two times a day (BID) | ORAL | 1 refills | Status: DC

## 2019-06-05 NOTE — Telephone Encounter (Signed)
New Message      *STAT* If patient is at the pharmacy, call can be transferred to refill team.   1. Which medications need to be refilled? (please list name of each medication and dose if known) metoprolol tartrate (LOPRESSOR) 25 MG tablet  2. Which pharmacy/location (including street and city if local pharmacy) is medication to be sent to? [place]  3. Do they need a 30 day or 90 day supply? [place]

## 2019-06-07 ENCOUNTER — Ambulatory Visit
Admission: RE | Admit: 2019-06-07 | Discharge: 2019-06-07 | Disposition: A | Discharge status: Home / Self Care | Payer: Medicare Other | Source: Ambulatory Visit | Attending: Family Medicine | Admitting: Family Medicine

## 2019-06-07 ENCOUNTER — Other Ambulatory Visit: Payer: Self-pay

## 2019-06-07 DIAGNOSIS — Z1231 Encounter for screening mammogram for malignant neoplasm of breast: Secondary | ICD-10-CM

## 2019-06-07 DIAGNOSIS — Z780 Asymptomatic menopausal state: Secondary | ICD-10-CM | POA: Diagnosis not present

## 2019-06-07 DIAGNOSIS — E2839 Other primary ovarian failure: Secondary | ICD-10-CM

## 2019-06-10 ENCOUNTER — Encounter: Payer: Self-pay | Admitting: Neurology

## 2019-06-10 ENCOUNTER — Ambulatory Visit (INDEPENDENT_AMBULATORY_CARE_PROVIDER_SITE_OTHER): Payer: Medicare Other | Admitting: Neurology

## 2019-06-10 ENCOUNTER — Other Ambulatory Visit: Payer: Self-pay

## 2019-06-10 ENCOUNTER — Telehealth: Payer: Self-pay

## 2019-06-10 VITALS — BP 178/74 | HR 54 | Ht 64.0 in | Wt 124.0 lb

## 2019-06-10 DIAGNOSIS — G40909 Epilepsy, unspecified, not intractable, without status epilepticus: Secondary | ICD-10-CM | POA: Diagnosis not present

## 2019-06-10 DIAGNOSIS — R002 Palpitations: Secondary | ICD-10-CM

## 2019-06-10 NOTE — Telephone Encounter (Signed)
Spoke to patient Dr.Berry advised to schedule 14 day Zio monitor and a follow up visit with him.Scheduler will call back to schedule.

## 2019-06-10 NOTE — Progress Notes (Signed)
I have read the note, and I agree with the clinical assessment and plan.  Charles K Willis   
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
## 2019-06-10 NOTE — Patient Instructions (Addendum)
I will order EEG Possibly, heart monitor, will let you know Check Keppra level today  See you in 6 months

## 2019-06-11 ENCOUNTER — Telehealth: Payer: Self-pay | Admitting: Neurology

## 2019-06-11 ENCOUNTER — Telehealth: Payer: Self-pay | Admitting: Radiology

## 2019-06-11 ENCOUNTER — Telehealth: Payer: Self-pay | Admitting: Cardiovascular Disease

## 2019-06-11 NOTE — Telephone Encounter (Signed)
I called pt to discuss, no answer, VM not set up.  We do not follow pt's BP concerns. She should likely follow up closely with cardiology and PCP for her BP.

## 2019-06-11 NOTE — Telephone Encounter (Signed)
Enrolled patient for a 14 day Zio monitor to be mailed to patients home.  

## 2019-06-11 NOTE — Telephone Encounter (Signed)
Pt called requesting a CB to discuss BP concerns

## 2019-06-11 NOTE — Telephone Encounter (Signed)
Please call to set up patient for zio monitor. Thank you

## 2019-06-11 NOTE — Telephone Encounter (Signed)
-----   Message from Luanna Salk, LPN sent at [phone]  7:52 PM EDT ----- Dr.Berry wants pt to wear a 14 day Zio monitor and schedule follow up appt after    Thanks

## 2019-06-13 LAB — LEVETIRACETAM LEVEL: Levetiracetam Lvl: 57.3 ug/mL — ABNORMAL HIGH (ref 10.0–40.0)

## 2019-06-13 NOTE — Telephone Encounter (Signed)
Spoke to pt she wanted to know if Judson Roch was aware of her BP during her OV  Informed pt that NP was aware

## 2019-06-18 ENCOUNTER — Telehealth: Payer: Self-pay | Admitting: *Deleted

## 2019-06-18 ENCOUNTER — Ambulatory Visit (INDEPENDENT_AMBULATORY_CARE_PROVIDER_SITE_OTHER): Payer: Medicare Other

## 2019-06-18 DIAGNOSIS — R002 Palpitations: Secondary | ICD-10-CM

## 2019-06-18 NOTE — Telephone Encounter (Signed)
-----   Message from Suzzanne Cloud, NP sent at 06/18/2019  6:06 AM EDT ----- Keppra blood level slightly high, may be related to timing of dose. Was checking to make sure therapeutic. Patient was having zoning out episodes, she is proceeding with cardiac monitor study with cardiology. We can keep dose the same for now, clarify if she just took the dose before appointment, contributing to higher level?

## 2019-06-18 NOTE — Telephone Encounter (Signed)
I called pt and relayed that per SS/NP tKeppra blood level slightly high, may be related to timing of dose.   Pt takes about 9-930am in am, this lab draw was collected 10:04am.  Her zoning out spells are every 1-2 days, varies.  She started wearing her cardiac monitor today. (for 14 days).   I would let SS/NP know.  No change in dosing at this time.   She verbalized understanding.

## 2019-07-09 ENCOUNTER — Ambulatory Visit (INDEPENDENT_AMBULATORY_CARE_PROVIDER_SITE_OTHER): Payer: Medicare Other | Admitting: Neurology

## 2019-07-09 DIAGNOSIS — G40909 Epilepsy, unspecified, not intractable, without status epilepticus: Secondary | ICD-10-CM

## 2019-07-16 ENCOUNTER — Other Ambulatory Visit: Payer: Self-pay

## 2019-07-16 ENCOUNTER — Ambulatory Visit (INDEPENDENT_AMBULATORY_CARE_PROVIDER_SITE_OTHER): Payer: Medicare Other | Admitting: Cardiovascular Disease

## 2019-07-16 ENCOUNTER — Encounter: Payer: Self-pay | Admitting: Cardiovascular Disease

## 2019-07-16 VITALS — BP 136/70 | HR 60 | Ht 64.0 in | Wt 124.0 lb

## 2019-07-16 DIAGNOSIS — I10 Essential (primary) hypertension: Secondary | ICD-10-CM

## 2019-07-16 DIAGNOSIS — R002 Palpitations: Secondary | ICD-10-CM

## 2019-07-16 NOTE — Patient Instructions (Signed)
Medication Instructions:  NO CHANGE *If you need a refill on your cardiac medications before your next appointment, please call your pharmacy*   Lab Work: If you have labs (blood work) drawn today and your tests are completely normal, you will receive your results only by: . MyChart Message (if you have MyChart) OR . A paper copy in the mail If you have any lab test that is abnormal or we need to change your treatment, we will call you to review the results.   Follow-Up: At [facility], you and your health needs are our priority.  As part of our continuing mission to provide you with exceptional heart care, we have created designated Provider Care Teams.  These Care Teams include your primary Cardiologist (physician) and Advanced Practice Providers (APPs -  Physician Assistants and Nurse Practitioners) who all work together to provide you with the care you need, when you need it.  We recommend signing up for the patient portal called "MyChart".  Sign up information is provided on this After Visit Summary.  MyChart is used to connect with patients for Virtual Visits (Telemedicine).  Patients are able to view lab/test results, encounter notes, upcoming appointments, etc.  Non-urgent messages can be sent to your provider as well.   To learn more about what you can do with MyChart, go to [email or web address].    Your next appointment:   12 month(s)  The format for your next appointment:   In Person  Provider:   You may see Jonathan Berry, MD or one of the following Advanced Practice Providers on your designated Care Team:    Luke Kilroy, PA-C  Callie Goodrich, PA-C  Jesse Cleaver, FNP     

## 2019-07-16 NOTE — Assessment & Plan Note (Signed)
History of palpitations with monitor performed 08/05/2018 showing predominantly sinus rhythm with runs of nonsustained ventricular tachycardia.  At that point, we started her on low-dose beta-blockade.  She still has episodes where she has tachypalpitations and briefly loses her memory but does not lose consciousness.  She did wear another monitor recently the results of which are currently unavailable to me.

## 2019-07-16 NOTE — Assessment & Plan Note (Signed)
History of essential hypertension a blood pressure measured today at 136/70.  She is on hydrochlorothiazide, losartan and Lopressor.

## 2019-07-16 NOTE — Progress Notes (Signed)
07/16/2019 Maria Livingston   25-Jan-1945  [id number]  Primary Physician Lois Huxley, PA Primary Cardiologist: Lorretta Harp MD FACP, [place], [place], [place]  HPI:  Maria Livingston is a 74 y.o.  thin appearing widowed African-American female mother of 1 son who I met in consultation during hospitalization in July 2020 for palpitations and diaphoresis.  I last saw her in the office 09/14/2018. She is retired from working at [employer] for 20 years and at [employer] for 20 years.  Risk factors include remote tobacco abuse and hypertension.  She drinks wine on a daily basis.  There is no family history of heart disease.  She is never had a heart attack or stroke and denies chest pain or shortness of breath.  A 2-week ZIO patch revealed a run of nonsustained ventricular tachycardia, some PACs and PVCs.  She did drink caffeinated beverages in the past which have been changed to decaf.  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 Harrell Gave.  Since I saw her a year ago she is done well.  She still has occasional episodes of tachypalpitations and says that at times she feels her heart flutter and loses short periods of memory.  She did recently wear a 2-week Zio patch which is still not resulted.  Her most recent lipid profile performed 11/15/2018 revealed total cholesterol 182, LDL 92 and HDL 76.   Current Meds  Medication Sig  . aspirin EC 81 MG tablet Take 81 mg by mouth daily.  . hydrochlorothiazide (HYDRODIURIL) 12.5 MG tablet Take 12.5 mg by mouth daily.  Marland Kitchen levETIRAcetam (KEPPRA) 750 MG tablet Take 1 tablet (750 mg total) by mouth every 12 (twelve) hours.  Marland Kitchen levothyroxine (SYNTHROID) 75 MCG tablet Take 37.5 mcg by mouth daily before breakfast.  . losartan (COZAAR) 50 MG tablet Take 50 mg by mouth daily.  . metoprolol tartrate (LOPRESSOR) 25 MG tablet Take 1 tablet (25 mg total) by  mouth 2 (two) times daily.  . mirtazapine (REMERON) 15 MG tablet Take 15 mg by mouth at bedtime.  . potassium chloride (KLOR-CON) 10 MEQ tablet Take 1 tablet (10 mEq total) by mouth daily.  Marland Kitchen VITAMIN D, CHOLECALCIFEROL, PO Take 50 mg by mouth daily.     No Known Allergies  Social History   Socioeconomic History  . Marital status: Single    Spouse name: Not on file  . Number of children: Not on file  . Years of education: Not on file  . Highest education level: Not on file  Occupational History  . Not on file  Tobacco Use  . Smoking status: Former Smoker    Packs/day: 1.00    Types: Cigarettes    Quit date: 07/02/2004    Years since quitting: 15.0  . Smokeless tobacco: Never Used  Substance and Sexual Activity  . Alcohol use: Yes    Alcohol/week: 0.0 standard drinks    Comment: 1 glass of wine per day  . Drug use: No  . Sexual activity: Not Currently  Other Topics Concern  . Not on file  Social History Narrative  . Not on file   Social Determinants of Health   Financial Resource Strain:   . Difficulty of Paying Living Expenses:   Food Insecurity:   . Worried About Charity fundraiser in the Last Year:   . Arboriculturist in  the Last Year:   Transportation Needs:   . Film/video editor (Medical):   Marland Kitchen Lack of Transportation (Non-Medical):   Physical Activity:   . Days of Exercise per Week:   . Minutes of Exercise per Session:   Stress:   . Feeling of Stress :   Social Connections:   . Frequency of Communication with Friends and Family:   . Frequency of Social Gatherings with Friends and Family:   . Attends Religious Services:   . Active Member of Clubs or Organizations:   . Attends Archivist Meetings:   Marland Kitchen Marital Status:   Intimate Partner Violence:   . Fear of Current or Ex-Partner:   . Emotionally Abused:   Marland Kitchen Physically Abused:   . Sexually Abused:      Review of Systems: General: negative for chills, fever, night sweats or weight  changes.  Cardiovascular: negative for chest pain, dyspnea on exertion, edema, orthopnea, palpitations, paroxysmal nocturnal dyspnea or shortness of breath Dermatological: negative for rash Respiratory: negative for cough or wheezing Urologic: negative for hematuria Abdominal: negative for nausea, vomiting, diarrhea, bright red blood per rectum, melena, or hematemesis Neurologic: negative for visual changes, syncope, or dizziness All other systems reviewed and are otherwise negative except as noted above.    Blood pressure 136/70, pulse 60, height '5\' 4"'  (1.626 m), weight 124 lb (56.2 kg), SpO2 98 %.  General appearance: alert and no distress Neck: no adenopathy, no carotid bruit, no JVD, supple, symmetrical, trachea midline and thyroid not enlarged, symmetric, no tenderness/mass/nodules Lungs: clear to auscultation bilaterally Heart: regular rate and rhythm, S1, S2 normal, no murmur, click, rub or gallop Extremities: extremities normal, atraumatic, no cyanosis or edema Pulses: 2+ and symmetric Skin: Skin color, texture, turgor normal. No rashes or lesions Neurologic: Alert and oriented X 3, normal strength and tone. Normal symmetric reflexes. Normal coordination and gait  EKG normal sinus rhythm, LVH voltage with repolarization changes and nonspecific ST and T wave changes.  I personally reviewed this EKG.  ASSESSMENT AND PLAN:   HTN (hypertension) History of essential hypertension a blood pressure measured today at 136/70.  She is on hydrochlorothiazide, losartan and Lopressor.  Palpitations History of palpitations with monitor performed 08/05/2018 showing predominantly sinus rhythm with runs of nonsustained ventricular tachycardia.  At that point, we started her on low-dose beta-blockade.  She still has episodes where she has tachypalpitations and briefly loses her memory but does not lose consciousness.  She did wear another monitor recently the results of which are currently  unavailable to me.      Lorretta Harp MD FACP,FACC,FAHA, [facility] 07/16/2019 2:07 PM

## 2019-07-22 NOTE — Procedures (Signed)
   HISTORY: 74 year old female with history of seizure  TECHNIQUE:  This is a routine 16 channel EEG recording with one channel devoted to a limited EKG recording.  It was performed during wakefulness, drowsiness and asleep.  Hyperventilation and photic stimulation were performed as activating procedures.  There are frequent eye blinking artifact  Upon maximum arousal, there was asymmetry of background activity, there was mildly dysrhythmic dysrhythmic right hemisphere 8 to 9 Hz activity,  with frequent sharp transient at Fp2, F8, T4, T6 leads; left side has rhythmic alpha range activity.  Hyperventilation produced mild/moderate buildup with higher amplitude and the slower activities noted.  Photic stimulation did not alter the tracing.  During EEG recording, patient developed drowsiness and no deeper stage of sleep was achieved  During EEG recording, there was no epileptiform discharge noted.  EKG demonstrate regular sinus rhythm  CONCLUSION: This is an abnormal study.  There is evidence of mild dysrhythmic slower activity involving right hemisphere, with frequent sharp transient involving right hemisphere frontal, temporal parietal region, indicating the focal irritability.  Marcial Pacas, M.D. Ph.D.  [facility] [place], [place] [address] Phone: [phone] Fax:      [phone]

## 2019-07-30 ENCOUNTER — Other Ambulatory Visit: Payer: Self-pay | Admitting: Family Medicine

## 2019-07-30 ENCOUNTER — Ambulatory Visit
Admission: RE | Admit: 2019-07-30 | Discharge: 2019-07-30 | Disposition: A | Discharge status: Home / Self Care | Payer: Medicare Other | Source: Ambulatory Visit | Attending: Family Medicine | Admitting: Family Medicine

## 2019-07-30 DIAGNOSIS — R61 Generalized hyperhidrosis: Secondary | ICD-10-CM

## 2019-08-06 DIAGNOSIS — L720 Epidermal cyst: Secondary | ICD-10-CM | POA: Diagnosis not present

## 2019-08-09 ENCOUNTER — Telehealth: Payer: Self-pay

## 2019-08-09 NOTE — Telephone Encounter (Signed)
Spoke to patient she stated she continues to have episodes she " just goes blank ".Stated she does not black out.She has frequent palpitations.Stated she spoke to [facility] about episodes at last office visit.Dr.Berry advised ok to increase metoprolol 25 mg to 1&1/2 tablets twice a day.Advised to call back if she continues to have episodes.

## 2019-09-10 ENCOUNTER — Telehealth: Payer: Self-pay | Admitting: Neurology

## 2019-09-10 ENCOUNTER — Other Ambulatory Visit: Payer: Self-pay | Admitting: *Deleted

## 2019-09-10 MED ORDER — LEVETIRACETAM 750 MG PO TABS: 750.0000 mg | ORAL_TABLET | Freq: Two times a day (BID) | ORAL | 1 refills | Status: DC

## 2019-09-10 MED ORDER — LEVETIRACETAM 750 MG PO TABS: 750.0000 mg | ORAL_TABLET | Freq: Two times a day (BID) | ORAL | 0 refills | Status: DC

## 2019-09-10 NOTE — Telephone Encounter (Signed)
Pt states she is in need of her mail order of levETIRAcetam (KEPPRA) 750 MG tablet to- [place], pt states she has been without this since last Thurs, she is asking if an amount can be called into [facility] while waiting on mail order

## 2019-09-10 NOTE — Telephone Encounter (Signed)
Pt has asked that it be added that she would like a call to know if there are any samples of this medication in the office since she has been without it since last Thurs, please call.

## 2019-09-10 NOTE — Telephone Encounter (Signed)
She normally gets her generic Keppra from [facility] through a mail order program. She is completely out of medication and needs to pick up a prescription today. We do not have samples in the office. A 30-day supply has been sent to [facility] on [address] at her request. She denies having any seizure activity due to missed doses. She will pick up her rx from [facility] today.

## 2019-10-12 DIAGNOSIS — Z23 Encounter for immunization: Secondary | ICD-10-CM | POA: Diagnosis not present

## 2019-12-09 ENCOUNTER — Ambulatory Visit (INDEPENDENT_AMBULATORY_CARE_PROVIDER_SITE_OTHER): Payer: Medicare Other | Admitting: Neurology

## 2019-12-09 ENCOUNTER — Encounter: Payer: Self-pay | Admitting: Neurology

## 2019-12-09 VITALS — BP 119/76 | HR 62 | Ht 64.0 in | Wt 124.8 lb

## 2019-12-09 DIAGNOSIS — G40909 Epilepsy, unspecified, not intractable, without status epilepticus: Secondary | ICD-10-CM | POA: Diagnosis not present

## 2019-12-09 MED ORDER — LEVETIRACETAM 1000 MG PO TABS: 1000.0000 mg | ORAL_TABLET | Freq: Two times a day (BID) | ORAL | 1 refills | Status: DC

## 2019-12-09 NOTE — Progress Notes (Signed)
PATIENT: Maria Livingston DOB: Apr 12, 1945  REASON FOR VISIT: follow up HISTORY FROM: patient  HISTORY OF PRESENT ILLNESS: Today 12/09/19 Maria Livingston is a 74 year old female with history of seizures since 2013. Typical seizures are generalized tonic-clonic, last in May 2018, back in Nov 2020 reported episodes of staring off, Keppra was increased 750 mg twice daily. Saw cardiology last year, wore a heart monitor in July 2020, nonsustained V. tach and palpitations, was started on metoprolol. Had EEG in June 2021, abnormal, evidence of mild dysrhythmic slow activity in the right hemisphere, with frequent sharp transient involving the right hemisphere frontal, temporal, parietal region, indicating focal irritability. Wore heart monitor again in June 2021, short runs of SVT and NSVT, metoprolol increased 25 mg, 1.5 tablets twice a day.  Continues to report about once daily spells, where time will stop, does not know what she is doing, last only a few seconds, then back to baseline.  Lives with her sister, has not noticed ever. She thinks the higher dose metoprolol was helpful, but is not sure. She does seem to stop what she is doing when the spells occur.  Feels nauseated before the spells, feels her heart is racing afterwards.  Remains on Keppra 750 mg twice daily.  Here today for follow-up unaccompanied.  HISTORY 06/10/2019 SS: Maria Livingston is a 74 year old female history of seizures since 2013.  Her typical seizures are generalized tonic-clonic (last in May 2018), when last seen, reported episodes of staring off.  Keppra was increased 750 mg twice a day.  Continues to report these episodes a few times a week, did not notice any change with higher dose of Keppra.  Describes them as staring off/zoning out lasting for a few seconds only, able to continue function, afterwards feels her heart racing/palpitations. She says no one would notice them, she knows because sense of loss of time. No loss of vision.  Had  cardiology evaluation last year, wore a heart monitor in July, nonsustained V. tach and palpitations, she was started on metoprolol.  She is not clear if she was having the staring episodes at that time.  Is compliant medications. Presents today for evaluation unaccompanied.  REVIEW OF SYSTEMS: Out of a complete 14 system review of symptoms, the patient complains only of the following symptoms, and all other reviewed systems are negative.  Seizures  ALLERGIES: No Known Allergies  HOME MEDICATIONS: Outpatient Medications Prior to Visit  Medication Sig Dispense Refill   aspirin EC 81 MG tablet Take 81 mg by mouth daily.     hydrochlorothiazide (HYDRODIURIL) 12.5 MG tablet Take 12.5 mg by mouth daily.     levothyroxine (SYNTHROID) 75 MCG tablet Take 37.5 mcg by mouth daily before breakfast.     losartan (COZAAR) 50 MG tablet Take 50 mg by mouth daily.     metoprolol tartrate (LOPRESSOR) 25 MG tablet Take 1&1/2 tablets twice a day. 180 tablet 3   mirtazapine (REMERON) 15 MG tablet Take 15 mg by mouth at bedtime.     VITAMIN D, CHOLECALCIFEROL, PO Take 50 mg by mouth daily.     levETIRAcetam (KEPPRA) 750 MG tablet Take 1 tablet (750 mg total) by mouth every 12 (twelve) hours. 180 tablet 1   levETIRAcetam (KEPPRA) 750 MG tablet Take 1 tablet (750 mg total) by mouth 2 (two) times daily. 60 tablet 0   potassium chloride (KLOR-CON) 10 MEQ tablet Take 1 tablet (10 mEq total) by mouth daily. 30 tablet 2   No facility-administered medications prior  to visit.    PAST MEDICAL HISTORY: Past Medical History:  Diagnosis Date   Estrogen deficiency    Hypertension    Hypothyroid    Seizure disorder ([place]) 11/29/2017   Seizures ([place])    Vision abnormalities     PAST SURGICAL HISTORY: Past Surgical History:  Procedure Laterality Date   ABDOMINAL HYSTERECTOMY     BREAST CYST EXCISION Bilateral 1970   CYSTECTOMY     bilateral breast   TEE WITHOUT CARDIOVERSION N/A 08/16/2018    Procedure: TRANSESOPHAGEAL ECHOCARDIOGRAM (TEE);  Surgeon: Buford Dresser, MD;  Location: [facility] ENDOSCOPY;  Service: Cardiovascular;  Laterality: N/A;   THYROID SURGERY     TOE SURGERY     TONSILLECTOMY      FAMILY HISTORY: Family History  Problem Relation Age of Onset   Osteoarthritis Mother    Hypertension Mother    Hypertension Father    Stroke Father    Hypertension Brother    Prostate cancer Brother     SOCIAL HISTORY: Social History   Socioeconomic History   Marital status: Single    Spouse name: Not on file   Number of children: Not on file   Years of education: Not on file   Highest education level: Not on file  Occupational History   Not on file  Tobacco Use   Smoking status: Former Smoker    Packs/day: 1.00    Types: Cigarettes    Quit date: 07/02/2004    Years since quitting: 15.4   Smokeless tobacco: Never Used  Substance and Sexual Activity   Alcohol use: Yes    Alcohol/week: 0.0 standard drinks    Comment: 1 glass of wine per day   Drug use: No   Sexual activity: Not Currently  Other Topics Concern   Not on file  Social History Narrative   Not on file   Social Determinants of Health   Financial Resource Strain:    Difficulty of Paying Living Expenses: Not on file  Food Insecurity:    Worried About [place] in the Last Year: Not on file   Ran Out of Food in the Last Year: Not on file  Transportation Needs:    Lack of Transportation (Medical): Not on file   Lack of Transportation (Non-Medical): Not on file  Physical Activity:    Days of Exercise per Week: Not on file   Minutes of Exercise per Session: Not on file  Stress:    Feeling of Stress : Not on file  Social Connections:    Frequency of Communication with Friends and Family: Not on file   Frequency of Social Gatherings with Friends and Family: Not on file   Attends Religious Services: Not on file   Active Member of Clubs or  Organizations: Not on file   Attends Archivist Meetings: Not on file   Marital Status: Not on file  Intimate Partner Violence:    Fear of Current or Ex-Partner: Not on file   Emotionally Abused: Not on file   Physically Abused: Not on file   Sexually Abused: Not on file   PHYSICAL EXAM  Vitals:   12/09/19 1238  BP: 119/76  Pulse: 62  Weight: 124 lb 12.8 oz (56.6 kg)  Height: 5\' 4"  (1.626 m)   Body mass index is 21.42 kg/m.  Generalized: Well developed, in no acute distress   Neurological examination  Mentation: Alert oriented to time, place, history taking. Follows all commands speech and language fluent Cranial nerve  II-XII: Pupils were equal round reactive to light. Extraocular movements were full, visual field were full on confrontational test. Facial sensation and strength were normal. Head turning and shoulder shrug  were normal and symmetric. Motor: The motor testing reveals 5 over 5 strength of all 4 extremities. Good symmetric motor tone is noted throughout.  Sensory: Sensory testing is intact to soft touch on all 4 extremities. No evidence of extinction is noted.  Coordination: Cerebellar testing reveals good finger-nose-finger and heel-to-shin bilaterally.  Gait and station: Gait is normal.  Reflexes: Deep tendon reflexes are symmetric and normal bilaterally.   DIAGNOSTIC DATA (LABS, IMAGING, TESTING) - I reviewed patient records, labs, notes, testing and imaging myself where available.  Lab Results  Component Value Date   WBC 7.1 08/09/2018   HGB 10.8 (L) 08/09/2018   HCT 31.9 (L) 08/09/2018   MCV 86 08/09/2018   PLT 267 08/09/2018      Component Value Date/Time   NA 144 08/21/2018 1030   K 3.4 (L) 08/21/2018 1030   CL 103 08/21/2018 1030   CO2 24 08/21/2018 1030   GLUCOSE 78 08/21/2018 1030   GLUCOSE 90 07/25/2018 0841   BUN 9 08/21/2018 1030   CREATININE 0.86 08/21/2018 1030   CALCIUM 10.0 08/21/2018 1030   PROT 7.0 07/25/2018 0841    ALBUMIN 4.3 07/25/2018 0841   AST 25 07/25/2018 0841   ALT 16 07/25/2018 0841   ALKPHOS 64 07/25/2018 0841   BILITOT 0.6 07/25/2018 0841   GFRNONAA 68 08/21/2018 1030   GFRAA 79 08/21/2018 1030   No results found for: CHOL, HDL, LDLCALC, LDLDIRECT, TRIG, CHOLHDL No results found for: HGBA1C No results found for: VITAMINB12 Lab Results  Component Value Date   TSH 1.060 08/21/2018    ASSESSMENT AND PLAN 74 y.o. year old female  has a past medical history of Estrogen deficiency, Hypertension, Hypothyroid, Seizure disorder ([place]) (11/29/2017), Seizures ([place]), and Vision abnormalities. here with:  1.  Seizure -Typical tonic-clonic seizure, last in May 2018 -In November 2020, started to complain of episodes of staring off, like time stops, feeling nauseated before, palpitations after -Has worn heart monitor twice, last in July 2021, showed short runs of SVT & NSVT, occasional PACs, PVCs, SR/SB/ST, metoprolol was increased -Still has near daily spells -EEG abnormal in June 2021, evidence of mild dysrhythmic slow activity involving right hemisphere with frequent sharp transient involving right hemisphere frontal, temporal, parietal region, indicating focal irritability -Unclear if spells are for sure cardiac related, but with abnormal EEG, recommend increasing Keppra 1000 mg twice daily -Document spells, will reach out to her in 1 month, to see if less frequent, at that point (I did send message to Dr. Gwenlyn Found today, he didn't feel spells were cardiac related at this time) -Recommend, she refrain from driving, given loss of function during spells,  law no driving until seizure-free for 6 months -Check Keppra level today, was last elevated, felt related to timing of dosing -Follow-up in 4 months or sooner if needed  I spent 30 minutes of face-to-face and non-face-to-face time with patient.  This included previsit chart review, lab review, study review, order entry, electronic health record  documentation, patient education.  Butler Denmark, AGNP-C, DNP 12/09/2019, 1:08 PM [facility] [address] [place],  [address] [phone]

## 2019-12-09 NOTE — Patient Instructions (Addendum)
Increase Keppra 1000 mg twice daily Check Keppra level today  Document spells See you back in 4 months

## 2019-12-09 NOTE — Progress Notes (Signed)
I have read the note, and I agree with the clinical assessment and plan.  Armoni Depass K Gizell Danser   

## 2019-12-16 ENCOUNTER — Telehealth: Payer: Self-pay | Admitting: *Deleted

## 2019-12-16 LAB — LEVETIRACETAM LEVEL: Levetiracetam Lvl: 58.5 ug/mL — ABNORMAL HIGH (ref 10.0–40.0)

## 2019-12-16 NOTE — Telephone Encounter (Signed)
Called VM not set up yet.  To call later.

## 2019-12-16 NOTE — Telephone Encounter (Signed)
-----   Message from Suzzanne Cloud, NP sent at 12/16/2019  7:44 AM EST ----- Keppra level on high end, but could be dose timing related. At last visit, we increased dose of Keppra to 1000 mg BID due to possible continued seizure spells. She should let me know in 1 month if higher dose is beneficial for spells. Tolerating higher dose Keppra okay?

## 2019-12-18 NOTE — Telephone Encounter (Signed)
I called pt and relayed that keppra level on high end but could be dose timing related. At visit increased keppra to 1000mg  po bid due to possible continued seizures spells.  She states that she is slightly better, tolerating with no problems.

## 2019-12-18 NOTE — Telephone Encounter (Signed)
-----   Message from Suzzanne Cloud, NP sent at 12/16/2019  7:44 AM EST ----- Keppra level on high end, but could be dose timing related. At last visit, we increased dose of Keppra to 1000 mg BID due to possible continued seizure spells. She should let me know in 1 month if higher dose is beneficial for spells. Tolerating higher dose Keppra okay?

## 2019-12-30 ENCOUNTER — Telehealth: Payer: Self-pay | Admitting: Neurology

## 2019-12-30 NOTE — Telephone Encounter (Addendum)
Please see how spells are doing with higher dose Keppra? ----- Message from Suzzanne Cloud, NP sent at 12/09/2019  1:17 PM EST ----- See how doing with spells. On Keppra.

## 2019-12-30 NOTE — Telephone Encounter (Signed)
Pt states that she feels like spells from 1 daily to now once weekly.  She will let us know how she is doing next week too.

## 2019-12-31 NOTE — Telephone Encounter (Signed)
I called pt to relay this dosing.  She then told me she is taking 1500mg  po bid of keppra.  That is what she has been taking since last seen in the office.  (1000mg  tablets) taking 1.5 tabs po bid.  I relayed since improved from last note, then will keep at that dose, unless I call her back after speaking with SS/NP.

## 2020-02-12 DIAGNOSIS — I10 Essential (primary) hypertension: Secondary | ICD-10-CM | POA: Diagnosis not present

## 2020-02-12 DIAGNOSIS — E039 Hypothyroidism, unspecified: Secondary | ICD-10-CM | POA: Diagnosis not present

## 2020-02-12 DIAGNOSIS — Z0000 Encounter for general adult medical examination without abnormal findings: Secondary | ICD-10-CM | POA: Diagnosis not present

## 2020-02-12 DIAGNOSIS — D649 Anemia, unspecified: Secondary | ICD-10-CM | POA: Diagnosis not present

## 2020-02-12 DIAGNOSIS — E041 Nontoxic single thyroid nodule: Secondary | ICD-10-CM | POA: Diagnosis not present

## 2020-02-12 DIAGNOSIS — E876 Hypokalemia: Secondary | ICD-10-CM | POA: Diagnosis not present

## 2020-02-12 DIAGNOSIS — Z87898 Personal history of other specified conditions: Secondary | ICD-10-CM | POA: Diagnosis not present

## 2020-02-12 DIAGNOSIS — N182 Chronic kidney disease, stage 2 (mild): Secondary | ICD-10-CM | POA: Diagnosis not present

## 2020-02-12 DIAGNOSIS — R809 Proteinuria, unspecified: Secondary | ICD-10-CM | POA: Diagnosis not present

## 2020-02-12 DIAGNOSIS — F324 Major depressive disorder, single episode, in partial remission: Secondary | ICD-10-CM | POA: Diagnosis not present

## 2020-02-12 DIAGNOSIS — R002 Palpitations: Secondary | ICD-10-CM | POA: Diagnosis not present

## 2020-02-12 DIAGNOSIS — Z1389 Encounter for screening for other disorder: Secondary | ICD-10-CM | POA: Diagnosis not present

## 2020-02-13 DIAGNOSIS — D649 Anemia, unspecified: Secondary | ICD-10-CM | POA: Diagnosis not present

## 2020-02-19 ENCOUNTER — Telehealth: Payer: Self-pay | Admitting: Neurology

## 2020-02-19 NOTE — Telephone Encounter (Signed)
I called the patient.  Is currently taking Keppra 1500 mg twice a day.  Has been trying to pay more attention to her symptoms.  Has noted 1-2 spells daily, felt to be seizures. Starts as follows:  1.  Feels nauseated 2.  Has a lapse in memory  3.  When she comes back around, her heart is racing; there is no shaking, falling to the ground  Recently she was at a wine store, was at checkout, the next thing she remembers, she had dropped a wine bottle on the floor and they were cleaning it up around her.  Had labs done with PCP recent, just got them in the mail, letter read kidney, liver function was within normal limits.  EEG in June 2021 was abnormal. There is evidence of mild dysrhythmic slower activity involving right hemisphere, with frequent sharp transient involving right hemisphere frontal, temporal parietal region, indicating the focal irritability.  She has had some cardiac issues, wore a heart monitor in July 2020, had nonsustained V. tach and palpitations was started on metoprolol.  Wore the heart monitor again in June 2021, short runs of SVT and HSVT, metoprolol was increased.  When I last saw the patient back in November, I did message Dr. Gwenlyn Found, at the time, did not feel spells were cardiac related.  I am thinking we should go ahead and add on 2nd AED medication, possibly Lamictal. Will run by Dr. Jannifer Franklin to see if other suggestions. These spells started in Nov 2020.  Has history of tonic-clonic seizures, last in May 2018.

## 2020-02-19 NOTE — Telephone Encounter (Signed)
Pt called, returning phone call from 3 weeks regarding blackouts we discussed. Would like a call from the nurse. Pt would not go into details.

## 2020-02-19 NOTE — Telephone Encounter (Signed)
I would agree with starting a second medication.  Lamictal would be adequate, Zonegran may be another good selection.

## 2020-02-19 NOTE — Telephone Encounter (Signed)
Called pt she relays now that she feels that she has not improved re: spells of what she decribes as black outs.  Gets upset stomach then has the black out spells, once daily.  Last ing about 1-2 minutes.  She is not driving now.  Taking keppra 1500mg  po bid.  Please advise.  (she is more aware of what she is feeling then before).

## 2020-02-20 MED ORDER — LEVETIRACETAM 1000 MG PO TABS: ORAL_TABLET | ORAL | 3 refills | Status: DC

## 2020-02-20 MED ORDER — ZONISAMIDE 50 MG PO CAPS: ORAL_CAPSULE | ORAL | 5 refills | Status: DC

## 2020-02-20 NOTE — Telephone Encounter (Signed)
I called her, I will add on Zonegran for seizure episodes. Start taking 50 mg twice daily for 2 weeks, then increase to 100 mg twice daily. No sulfa allergy, recommend no driving as spells do impair awareness. She lives with her sister. Let me know how she does, if spells continue. I refilled Keppra 1500 mg BID today.

## 2020-02-20 NOTE — Addendum Note (Signed)
Addended by: Suzzanne Cloud on: 02/20/2020 10:59 AM   Modules accepted: Orders

## 2020-04-07 ENCOUNTER — Encounter: Payer: Self-pay | Admitting: Neurology

## 2020-04-07 ENCOUNTER — Ambulatory Visit: Payer: Medicare Other | Admitting: Neurology

## 2020-04-07 NOTE — Progress Notes (Deleted)
PATIENT: Maria Livingston DOB: 1945/09/18  REASON FOR VISIT: follow up HISTORY FROM: patient  HISTORY OF PRESENT ILLNESS: Today 04/07/20 Maria Livingston is a 75 year old female with history of seizures, typical are generalized tonic-clonic, and in November 2020 started complaining of episodes of staring off.  Has cardiology consult.  EEG has been abnormal.  Her staring spells are described as:  1.  Feels nauseated 2.  Has a lapse in memory  3.  When she comes back around, her heart is racing; there is no shaking, falling to the ground  Remains on Keppra 1500 mg twice a day, Zonegran was added in February 2022.   Update 12/09/2019 SS: Maria Livingston is a 75 year old female with history of seizures since 2013. Typical seizures are generalized tonic-clonic, last in May 2018, back in Nov 2020 reported episodes of staring off, Keppra was increased 750 mg twice daily. Saw cardiology last year, wore a heart monitor in July 2020, nonsustained V. tach and palpitations, was started on metoprolol. Had EEG in June 2021, abnormal, evidence of mild dysrhythmic slow activity in the right hemisphere, with frequent sharp transient involving the right hemisphere frontal, temporal, parietal region, indicating focal irritability. Wore heart monitor again in June 2021, short runs of SVT and NSVT, metoprolol increased 25 mg, 1.5 tablets twice a day.  Continues to report about once daily spells, where time will stop, does not know what she is doing, last only a few seconds, then back to baseline.  Lives with her sister, has not noticed ever. She thinks the higher dose metoprolol was helpful, but is not sure. She does seem to stop what she is doing when the spells occur.  Feels nauseated before the spells, feels her heart is racing afterwards.  Remains on Keppra 750 mg twice daily.  Here today for follow-up unaccompanied.  HISTORY 06/10/2019 SS: Maria Livingston is a 75 year old female history of seizures since 2013.  Her typical  seizures are generalized tonic-clonic (last in May 2018), when last seen, reported episodes of staring off.  Keppra was increased 750 mg twice a day.  Continues to report these episodes a few times a week, did not notice any change with higher dose of Keppra.  Describes them as staring off/zoning out lasting for a few seconds only, able to continue function, afterwards feels her heart racing/palpitations. She says no one would notice them, she knows because sense of loss of time. No loss of vision.  Had cardiology evaluation last year, wore a heart monitor in July, nonsustained V. tach and palpitations, she was started on metoprolol.  She is not clear if she was having the staring episodes at that time.  Is compliant medications. Presents today for evaluation unaccompanied.  REVIEW OF SYSTEMS: Out of a complete 14 system review of symptoms, the patient complains only of the following symptoms, and all other reviewed systems are negative.  Seizures  ALLERGIES: No Known Allergies  HOME MEDICATIONS: Outpatient Medications Prior to Visit  Medication Sig Dispense Refill  . aspirin EC 81 MG tablet Take 81 mg by mouth daily.    . hydrochlorothiazide (HYDRODIURIL) 12.5 MG tablet Take 12.5 mg by mouth daily.    Marland Kitchen levETIRAcetam (KEPPRA) 1000 MG tablet Take 1.5 tablets twice daily 270 tablet 3  . levothyroxine (SYNTHROID) 75 MCG tablet Take 37.5 mcg by mouth daily before breakfast.    . losartan (COZAAR) 50 MG tablet Take 50 mg by mouth daily.    . metoprolol tartrate (LOPRESSOR) 25 MG tablet Take  1&1/2 tablets twice a day. 180 tablet 3  . mirtazapine (REMERON) 15 MG tablet Take 15 mg by mouth at bedtime.    . potassium chloride (KLOR-CON) 10 MEQ tablet Take 1 tablet (10 mEq total) by mouth daily. 30 tablet 2  . VITAMIN D, CHOLECALCIFEROL, PO Take 50 mg by mouth daily.    Marland Kitchen zonisamide (ZONEGRAN) 50 MG capsule Take 1 capsule twice daily x 2 weeks, then take 2 capsules twice daily 120 capsule 5   No  facility-administered medications prior to visit.    PAST MEDICAL HISTORY: Past Medical History:  Diagnosis Date  . Estrogen deficiency   . Hypertension   . Hypothyroid   . Seizure disorder ([place]) 11/29/2017  . Seizures ([place])   . Vision abnormalities     PAST SURGICAL HISTORY: Past Surgical History:  Procedure Laterality Date  . ABDOMINAL HYSTERECTOMY    . BREAST CYST EXCISION Bilateral 1970  . CYSTECTOMY     bilateral breast  . TEE WITHOUT CARDIOVERSION N/A 08/16/2018   Procedure: TRANSESOPHAGEAL ECHOCARDIOGRAM (TEE);  Surgeon: Buford Dresser, MD;  Location: [facility] ENDOSCOPY;  Service: Cardiovascular;  Laterality: N/A;  . THYROID SURGERY    . TOE SURGERY    . TONSILLECTOMY      FAMILY HISTORY: Family History  Problem Relation Age of Onset  . Osteoarthritis Mother   . Hypertension Mother   . Hypertension Father   . Stroke Father   . Hypertension Brother   . Prostate cancer Brother     SOCIAL HISTORY: Social History   Socioeconomic History  . Marital status: Single    Spouse name: Not on file  . Number of children: Not on file  . Years of education: Not on file  . Highest education level: Not on file  Occupational History  . Not on file  Tobacco Use  . Smoking status: Former Smoker    Packs/day: 1.00    Types: Cigarettes    Quit date: 07/02/2004    Years since quitting: 15.7  . Smokeless tobacco: Never Used  Substance and Sexual Activity  . Alcohol use: Yes    Alcohol/week: 0.0 standard drinks    Comment: 1 glass of wine per day  . Drug use: No  . Sexual activity: Not Currently  Other Topics Concern  . Not on file  Social History Narrative  . Not on file   Social Determinants of Health   Financial Resource Strain: Not on file  Food Insecurity: Not on file  Transportation Needs: Not on file  Physical Activity: Not on file  Stress: Not on file  Social Connections: Not on file  Intimate Partner Violence: Not on file   PHYSICAL EXAM  There  were no vitals filed for this visit. There is no height or weight on file to calculate BMI.  Generalized: Well developed, in no acute distress   Neurological examination  Mentation: Alert oriented to time, place, history taking. Follows all commands speech and language fluent Cranial nerve II-XII: Pupils were equal round reactive to light. Extraocular movements were full, visual field were full on confrontational test. Facial sensation and strength were normal. Head turning and shoulder shrug  were normal and symmetric. Motor: The motor testing reveals 5 over 5 strength of all 4 extremities. Good symmetric motor tone is noted throughout.  Sensory: Sensory testing is intact to soft touch on all 4 extremities. No evidence of extinction is noted.  Coordination: Cerebellar testing reveals good finger-nose-finger and heel-to-shin bilaterally.  Gait and station: Gait  is normal.  Reflexes: Deep tendon reflexes are symmetric and normal bilaterally.   DIAGNOSTIC DATA (LABS, IMAGING, TESTING) - I reviewed patient records, labs, notes, testing and imaging myself where available.  Lab Results  Component Value Date   WBC 7.1 08/09/2018   HGB 10.8 (L) 08/09/2018   HCT 31.9 (L) 08/09/2018   MCV 86 08/09/2018   PLT 267 08/09/2018      Component Value Date/Time   NA 144 08/21/2018 1030   K 3.4 (L) 08/21/2018 1030   CL 103 08/21/2018 1030   CO2 24 08/21/2018 1030   GLUCOSE 78 08/21/2018 1030   GLUCOSE 90 07/25/2018 0841   BUN 9 08/21/2018 1030   CREATININE 0.86 08/21/2018 1030   CALCIUM 10.0 08/21/2018 1030   PROT 7.0 07/25/2018 0841   ALBUMIN 4.3 07/25/2018 0841   AST 25 07/25/2018 0841   ALT 16 07/25/2018 0841   ALKPHOS 64 07/25/2018 0841   BILITOT 0.6 07/25/2018 0841   GFRNONAA 68 08/21/2018 1030   GFRAA 79 08/21/2018 1030   No results found for: CHOL, HDL, LDLCALC, LDLDIRECT, TRIG, CHOLHDL No results found for: HGBA1C No results found for: VITAMINB12 Lab Results  Component Value  Date   TSH 1.060 08/21/2018    ASSESSMENT AND PLAN 75 y.o. year old female  has a past medical history of Estrogen deficiency, Hypertension, Hypothyroid, Seizure disorder ([place]) (11/29/2017), Seizures ([place]), and Vision abnormalities. here with:  1.  Seizure -Typical tonic-clonic seizure, last in May 2018 -In November 2020, started to complain of episodes of staring off, like time stops, feeling nauseated before, palpitations after -Has worn heart monitor twice, last in July 2021, showed short runs of SVT & NSVT, occasional PACs, PVCs, SR/SB/ST, metoprolol was increased -Still has near daily spells -EEG abnormal in June 2021, evidence of mild dysrhythmic slow activity involving right hemisphere with frequent sharp transient involving right hemisphere frontal, temporal, parietal region, indicating focal irritability -Unclear if spells are for sure cardiac related, but with abnormal EEG, recommend increasing Keppra 1000 mg twice daily -Document spells, will reach out to her in 1 month, to see if less frequent, at that point (I did send message to Dr. Gwenlyn Found today, he didn't feel spells were cardiac related at this time) -Recommend, she refrain from driving, given loss of function during spells, [place] law no driving until seizure-free for 6 months -Check Keppra level today, was last elevated, felt related to timing of dosing -Follow-up in 4 months or sooner if needed  I spent 30 minutes of face-to-face and non-face-to-face time with patient.  This included previsit chart review, lab review, study review, order entry, electronic health record documentation, patient education.  Butler Denmark, AGNP-C, DNP 04/07/2020, 5:46 AM [facility] [address], [place], [place] [address] [phone]

## 2020-04-28 ENCOUNTER — Ambulatory Visit (INDEPENDENT_AMBULATORY_CARE_PROVIDER_SITE_OTHER): Payer: Medicare Other | Admitting: Neurology

## 2020-04-28 ENCOUNTER — Encounter: Payer: Self-pay | Admitting: Neurology

## 2020-04-28 VITALS — BP 134/74 | HR 53 | Ht 64.0 in | Wt 125.0 lb

## 2020-04-28 DIAGNOSIS — G40909 Epilepsy, unspecified, not intractable, without status epilepticus: Secondary | ICD-10-CM | POA: Diagnosis not present

## 2020-04-28 MED ORDER — ZONISAMIDE 100 MG PO CAPS: 100.0000 mg | ORAL_CAPSULE | Freq: Two times a day (BID) | ORAL | 1 refills | Status: DC

## 2020-04-28 NOTE — Progress Notes (Signed)
I have read the note, and I agree with the clinical assessment and plan.  Semira Stoltzfus K Timea Breed   

## 2020-04-28 NOTE — Progress Notes (Signed)
PATIENT: Maria Livingston DOB: 06/08/1945  REASON FOR VISIT: follow up HISTORY FROM: patient  HISTORY OF PRESENT ILLNESS: Today 04/28/20 [name] is a 75 year old female with history of seizures, typical are generalized tonic-clonic, and in November 2020 started complaining of episodes of staring off.  Has cardiology consult.  EEG has been abnormal.  Her staring spells are described as:  1.  Feels nauseated 2.  Has a lapse in memory  3.  When she comes back around, her heart is racing; there is no shaking, falling to the ground  Remains on Keppra 1500 mg twice a day, Zonegran was added in February 2022, currently taking 100 mg twice daily.  Since the addition of Zonegran, has had no further staring spells.  In general, she is feeling better, has more energy, is walking.  She denies any side effects of the medications.  She is compliant.  Continues to live with her sister, she is not driving.  She is pleased with how well she is doing.  Here today for evaluation unaccompanied.  Update 12/09/2019 SS: [name] is a 75 year old female with history of seizures since 2013. Typical seizures are generalized tonic-clonic, last in May 2018, back in Nov 2020 reported episodes of staring off, Keppra was increased 750 mg twice daily. Saw cardiology last year, wore a heart monitor in July 2020, nonsustained V. tach and palpitations, was started on metoprolol. Had EEG in June 2021, abnormal, evidence of mild dysrhythmic slow activity in the right hemisphere, with frequent sharp transient involving the right hemisphere frontal, temporal, parietal region, indicating focal irritability. Wore heart monitor again in June 2021, short runs of SVT and NSVT, metoprolol increased 25 mg, 1.5 tablets twice a day.  Continues to report about once daily spells, where time will stop, does not know what she is doing, last only a few seconds, then back to baseline.  Lives with her sister, has not noticed ever. She thinks the  higher dose metoprolol was helpful, but is not sure. She does seem to stop what she is doing when the spells occur.  Feels nauseated before the spells, feels her heart is racing afterwards.  Remains on Keppra 750 mg twice daily.  Here today for follow-up unaccompanied.  HISTORY 06/10/2019 SS: [name] is a 75 year old female history of seizures since 2013.  Her typical seizures are generalized tonic-clonic (last in May 2018), when last seen, reported episodes of staring off.  Keppra was increased 750 mg twice a day.  Continues to report these episodes a few times a week, did not notice any change with higher dose of Keppra.  Describes them as staring off/zoning out lasting for a few seconds only, able to continue function, afterwards feels her heart racing/palpitations. She says no one would notice them, she knows because sense of loss of time. No loss of vision.  Had cardiology evaluation last year, wore a heart monitor in July, nonsustained V. tach and palpitations, she was started on metoprolol.  She is not clear if she was having the staring episodes at that time.  Is compliant medications. Presents today for evaluation unaccompanied.  REVIEW OF SYSTEMS: Out of a complete 14 system review of symptoms, the patient complains only of the following symptoms, and all other reviewed systems are negative.  Seizures  ALLERGIES: No Known Allergies  HOME MEDICATIONS: Outpatient Medications Prior to Visit  Medication Sig Dispense Refill  . aspirin EC 81 MG tablet Take 81 mg by mouth daily.    Marland Kitchen  hydrochlorothiazide (HYDRODIURIL) 12.5 MG tablet Take 12.5 mg by mouth daily.    Marland Kitchen levETIRAcetam (KEPPRA) 1000 MG tablet Take 1.5 tablets twice daily 270 tablet 3  . levothyroxine (SYNTHROID) 75 MCG tablet Take 37.5 mcg by mouth daily before breakfast.    . losartan (COZAAR) 50 MG tablet Take 50 mg by mouth daily.    . metoprolol tartrate (LOPRESSOR) 25 MG tablet Take 1&1/2 tablets twice a day. 180 tablet 3  .  mirtazapine (REMERON) 15 MG tablet Take 15 mg by mouth at bedtime.    . potassium chloride (KLOR-CON) 10 MEQ tablet Take 1 tablet (10 mEq total) by mouth daily. 30 tablet 2  . VITAMIN D, CHOLECALCIFEROL, PO Take 50 mg by mouth daily.    Marland Kitchen zonisamide (ZONEGRAN) 50 MG capsule Take 1 capsule twice daily x 2 weeks, then take 2 capsules twice daily 120 capsule 5   No facility-administered medications prior to visit.    PAST MEDICAL HISTORY: Past Medical History:  Diagnosis Date  . Estrogen deficiency   . Hypertension   . Hypothyroid   . Seizure disorder ([place]) 11/29/2017  . Seizures ([place])   . Vision abnormalities     PAST SURGICAL HISTORY: Past Surgical History:  Procedure Laterality Date  . ABDOMINAL HYSTERECTOMY    . BREAST CYST EXCISION Bilateral 1970  . CYSTECTOMY     bilateral breast  . TEE WITHOUT CARDIOVERSION N/A 08/16/2018   Procedure: TRANSESOPHAGEAL ECHOCARDIOGRAM (TEE);  Surgeon: Buford Dresser, MD;  Location[facility] ENDOSCOPY;  Service: Cardiovascular;  Laterality: N/A;  . THYROID SURGERY    . TOE SURGERY    . TONSILLECTOMY      FAMILY HISTORY: Family History  Problem Relation Age of Onset  . Osteoarthritis Mother   . Hypertension Mother   . Hypertension Father   . Stroke Father   . Hypertension Brother   . Prostate cancer Brother     SOCIAL HISTORY: Social History   Socioeconomic History  . Marital status: Single    Spouse name: Not on file  . Number of children: Not on file  . Years of education: Not on file  . Highest education level: Not on file  Occupational History  . Not on file  Tobacco Use  . Smoking status: Former Smoker    Packs/day: 1.00    Types: Cigarettes    Quit date: 07/02/2004    Years since quitting: 15.8  . Smokeless tobacco: Never Used  Substance and Sexual Activity  . Alcohol use: Yes    Alcohol/week: 0.0 standard drinks    Comment: 1 glass of wine per day  . Drug use: No  . Sexual activity: Not Currently  Other Topics  Concern  . Not on file  Social History Narrative  . Not on file   Social Determinants of Health   Financial Resource Strain: Not on file  Food Insecurity: Not on file  Transportation Needs: Not on file  Physical Activity: Not on file  Stress: Not on file  Social Connections: Not on file  Intimate Partner Violence: Not on file   PHYSICAL EXAM  There were no vitals filed for this visit. There is no height or weight on file to calculate BMI.  Generalized: Well developed, in no acute distress   Neurological examination  Mentation: Alert oriented to time, place, history taking. Follows all commands speech and language fluent Cranial nerve II-XII: Pupils were equal round reactive to light. Extraocular movements were full, visual field were full on confrontational test. Facial  sensation and strength were normal. Head turning and shoulder shrug  were normal and symmetric. Motor: The motor testing reveals 5 over 5 strength of all 4 extremities. Good symmetric motor tone is noted throughout.  Sensory: Sensory testing is intact to soft touch on all 4 extremities. No evidence of extinction is noted.  Coordination: Cerebellar testing reveals good finger-nose-finger and heel-to-shin bilaterally.  Gait and station: Gait is normal.  Tandem gait is normal. Reflexes: Deep tendon reflexes are symmetric and normal bilaterally.   DIAGNOSTIC DATA (LABS, IMAGING, TESTING) - I reviewed patient records, labs, notes, testing and imaging myself where available.  Lab Results  Component Value Date   WBC 7.1 08/09/2018   HGB 10.8 (L) 08/09/2018   HCT 31.9 (L) 08/09/2018   MCV 86 08/09/2018   PLT 267 08/09/2018      Component Value Date/Time   NA 144 08/21/2018 1030   K 3.4 (L) 08/21/2018 1030   CL 103 08/21/2018 1030   CO2 24 08/21/2018 1030   GLUCOSE 78 08/21/2018 1030   GLUCOSE 90 07/25/2018 0841   BUN 9 08/21/2018 1030   CREATININE 0.86 08/21/2018 1030   CALCIUM 10.0 08/21/2018 1030   PROT  7.0 07/25/2018 0841   ALBUMIN 4.3 07/25/2018 0841   AST 25 07/25/2018 0841   ALT 16 07/25/2018 0841   ALKPHOS 64 07/25/2018 0841   BILITOT 0.6 07/25/2018 0841   GFRNONAA 68 08/21/2018 1030   GFRAA 79 08/21/2018 1030   No results found for: CHOL, HDL, LDLCALC, LDLDIRECT, TRIG, CHOLHDL No results found for: HGBA1C No results found for: VITAMINB12 Lab Results  Component Value Date   TSH 1.060 08/21/2018    ASSESSMENT AND PLAN 75 y.o. year old female  has a past medical history of Estrogen deficiency, Hypertension, Hypothyroid, Seizure disorder ([place]) (11/29/2017), Seizures ([place]), and Vision abnormalities. here with:  1.  Seizure -Typical tonic-clonic seizure, last in May 2018 -In November 2020, started to complain of episodes of staring off, like time stops, feeling nauseated before, palpitations after -Has worn heart monitor twice, last in July 2021, showed short runs of SVT & NSVT, occasional PACs, PVCs, SR/SB/ST, metoprolol was increased -EEG abnormal in June 2021, evidence of mild dysrhythmic slow activity involving right hemisphere with frequent sharp transient involving right hemisphere frontal, temporal, parietal region, indicating focal irritability -Zonegran was added in February 2022, since then no further staring spells, tolerates medications well -Continue Zonegran 100 mg twice daily -Continue Keppra 1000 mg, 1.5 tablets twice daily -Sees Dr. Gwenlyn Found for follow-up in June 2022 -Recommend, she refrain from driving, given loss of function during spells, [place] law no driving until seizure-free for 6 months -Check routine labs today, seizure levels -Call for seizure activity, otherwise follow-up 6 months or sooner if needed  I spent 30 minutes of face-to-face and non-face-to-face time with patient.  This included previsit chart review, lab review, study review, order entry, electronic health record documentation, patient education.  Butler Denmark, AGNP-C, DNP 04/28/2020, 5:47  AM [facility] [address] [place], [place] [address] [phone]

## 2020-04-28 NOTE — Patient Instructions (Signed)
Check labs today  Continue current medications Call for seizures See you back in 6 months

## 2020-05-03 LAB — ZONISAMIDE LEVEL: Zonisamide: 11.7 ug/mL (ref 10.0–40.0)

## 2020-05-03 LAB — COMPREHENSIVE METABOLIC PANEL
ALT: 18 IU/L (ref 0–32)
AST: 20 IU/L (ref 0–40)
Albumin/Globulin Ratio: 2 (ref 1.2–2.2)
Albumin: 4.8 g/dL — ABNORMAL HIGH (ref 3.7–4.7)
Alkaline Phosphatase: 67 IU/L (ref 44–121)
BUN/Creatinine Ratio: 12 (ref 12–28)
BUN: 13 mg/dL (ref 8–27)
Bilirubin Total: 0.4 mg/dL (ref 0.0–1.2)
CO2: 19 mmol/L — ABNORMAL LOW (ref 20–29)
Calcium: 10.3 mg/dL (ref 8.7–10.3)
Chloride: 110 mmol/L — ABNORMAL HIGH (ref 96–106)
Creatinine, Ser: 1.09 mg/dL — ABNORMAL HIGH (ref 0.57–1.00)
Globulin, Total: 2.4 g/dL (ref 1.5–4.5)
Glucose: 80 mg/dL (ref 65–99)
Potassium: 4.9 mmol/L (ref 3.5–5.2)
Sodium: 145 mmol/L — ABNORMAL HIGH (ref 134–144)
Total Protein: 7.2 g/dL (ref 6.0–8.5)
eGFR: 54 mL/min/{1.73_m2} — ABNORMAL LOW (ref >59)

## 2020-05-03 LAB — CBC WITH DIFFERENTIAL/PLATELET
Basophils Absolute: 0 10*3/uL (ref 0.0–0.2)
Basos: 0 %
EOS (ABSOLUTE): 0.1 10*3/uL (ref 0.0–0.4)
Eos: 2 %
Hematocrit: 29.9 % — ABNORMAL LOW (ref 34.0–46.6)
Hemoglobin: 10 g/dL — ABNORMAL LOW (ref 11.1–15.9)
Immature Grans (Abs): 0 10*3/uL (ref 0.0–0.1)
Immature Granulocytes: 0 %
Lymphocytes Absolute: 3.5 10*3/uL — ABNORMAL HIGH (ref 0.7–3.1)
Lymphs: 59 %
MCH: 31.1 pg (ref 26.6–33.0)
MCHC: 33.4 g/dL (ref 31.5–35.7)
MCV: 93 fL (ref 79–97)
Monocytes Absolute: 0.5 10*3/uL (ref 0.1–0.9)
Monocytes: 9 %
Neutrophils Absolute: 1.8 10*3/uL (ref 1.4–7.0)
Neutrophils: 30 %
Platelets: 254 10*3/uL (ref 150–450)
RBC: 3.22 x10E6/uL — ABNORMAL LOW (ref 3.77–5.28)
RDW: 13.1 % (ref 11.7–15.4)
WBC: 5.9 10*3/uL (ref 3.4–10.8)

## 2020-05-03 LAB — LEVETIRACETAM LEVEL: Levetiracetam Lvl: 81.3 ug/mL — ABNORMAL HIGH (ref 10.0–40.0)

## 2020-05-05 ENCOUNTER — Telehealth: Payer: Self-pay

## 2020-05-05 NOTE — Telephone Encounter (Signed)
-----   Message from Suzzanne Cloud, NP sent at 05/05/2020  4:00 PM EDT ----- Sent my chart message: Silva Bandy,  CBC shows mild anemia, HGB 10.0, appears slightly lower than 1 year prior. Mild increase in creatinine 1.09, sodium mildly elevated 145, ensure you are drinking enough water. Zonegran level is within normal range, Keppra level is elevated, however will not alter dosing since tolerating well, and no recurrent seizures with establishment of dual therapy. Chloride level is mildly elevated, CO2  mildly low, likely related to Zonegran, will follow overtime.  Judson Roch

## 2020-05-11 ENCOUNTER — Telehealth: Payer: Self-pay

## 2020-05-11 NOTE — Telephone Encounter (Signed)
-----   Message from Suzzanne Cloud, NP sent at 05/11/2020  8:10 AM EDT ----- Can you please call the patient, I got message of unviewed my chart comments.

## 2020-05-11 NOTE — Telephone Encounter (Signed)
Pt verified by name and DOB,  normal results given per provider, pt voiced understanding all question answered.  -- Message from Suzzanne Cloud, NP sent at 05/05/2020  4:00 PM EDT ----- Sent my chart message: Maria Livingston,  CBC shows mild anemia, HGB 10.0, appears slightly lower than 1 year prior. Mild increase in creatinine 1.09, sodium mildly elevated 145, ensure you are drinking enough water. Zonegran level is within normal range, Keppra level is elevated, however will not alter dosing since tolerating well, and no recurrent seizures with establishment of dual therapy. Chloride level is mildly elevated, CO2  mildly low, likely related to Zonegran, will follow overtime.  Judson Roch

## 2020-06-16 NOTE — Telephone Encounter (Deleted)
Error

## 2020-06-16 NOTE — Telephone Encounter (Signed)
Error

## 2020-06-24 DIAGNOSIS — Z23 Encounter for immunization: Secondary | ICD-10-CM | POA: Diagnosis not present

## 2020-07-14 ENCOUNTER — Other Ambulatory Visit: Payer: Self-pay

## 2020-07-14 ENCOUNTER — Encounter: Payer: Self-pay | Admitting: Cardiovascular Disease

## 2020-07-14 ENCOUNTER — Ambulatory Visit (INDEPENDENT_AMBULATORY_CARE_PROVIDER_SITE_OTHER): Payer: Medicare Other | Admitting: Cardiovascular Disease

## 2020-07-14 VITALS — BP 118/62 | HR 63 | Ht 64.0 in | Wt 130.6 lb

## 2020-07-14 DIAGNOSIS — I10 Essential (primary) hypertension: Secondary | ICD-10-CM | POA: Diagnosis not present

## 2020-07-14 DIAGNOSIS — R002 Palpitations: Secondary | ICD-10-CM | POA: Diagnosis not present

## 2020-07-14 NOTE — Progress Notes (Signed)
07/14/2020 Maria Livingston   October 11, 1945  [id number]  Primary Physician Lois Huxley, PA Primary Cardiologist: Lorretta Harp MD FACP, [place], [place], [place]  HPI:  Maria Livingston is a 75 y.o.   thin appearing widowed African-American female mother of 1 son who I met in consultation during hospitalization in July 2020 for palpitations and diaphoresis.  I last saw her in the office 07/16/2019. She is retired from working at [employer] for 20 years and at [employer] for 20 years.  Risk factors include remote tobacco abuse and hypertension.  She drinks wine on a daily basis.  There is no family history of heart disease.  She is never had a heart attack or stroke and denies chest pain or shortness of breath.  A 2-week ZIO patch revealed a run of nonsustained ventricular tachycardia, some PACs and PVCs.  She did drink caffeinated beverages in the past which have been changed to decaf.  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 Harrell Gave.   Since I saw her a year ago she is done well.  He tachypalpitations which she was experiencing have resolved since her PCP changed her seizure medication.  She did have a Zio patch which revealed occasional PACs, PVCs and short runs of SVT and nonsustained ventricular tachycardia.  She denies chest pain or shortness of breath.  She works out in the yard doing yard work without limitation.  Current Meds  Medication Sig   aspirin EC 81 MG tablet Take 81 mg by mouth daily.   hydrochlorothiazide (HYDRODIURIL) 12.5 MG tablet Take 12.5 mg by mouth daily.   levETIRAcetam (KEPPRA) 1000 MG tablet Take 1.5 tablets twice daily   levothyroxine (SYNTHROID) 75 MCG tablet Take 37.5 mcg by mouth daily before breakfast.   losartan (COZAAR) 50 MG tablet Take 50 mg by mouth daily.   metoprolol tartrate (LOPRESSOR) 25 MG tablet Take 1&1/2 tablets twice a day.    mirtazapine (REMERON) 15 MG tablet Take 15 mg by mouth at bedtime.   potassium chloride (KLOR-CON) 10 MEQ tablet Take 1 tablet (10 mEq total) by mouth daily.   VITAMIN D, CHOLECALCIFEROL, PO Take 50 mg by mouth daily.   zonisamide (ZONEGRAN) 100 MG capsule Take 1 capsule (100 mg total) by mouth 2 (two) times daily.     No Known Allergies  Social History   Socioeconomic History   Marital status: Single    Spouse name: Not on file   Number of children: Not on file   Years of education: Not on file   Highest education level: Not on file  Occupational History   Not on file  Tobacco Use   Smoking status: Former    Packs/day: 1.00    Pack years: 0.00    Types: Cigarettes    Quit date: 07/02/2004    Years since quitting: 16.0   Smokeless tobacco: Never  Substance and Sexual Activity   Alcohol use: Yes    Alcohol/week: 0.0 standard drinks    Comment: 1 glass of wine per day   Drug use: No   Sexual activity: Not Currently  Other Topics Concern   Not on file  Social History Narrative   Not on file   Social Determinants of Health   Financial Resource Strain: Not on file  Food Insecurity: Not on file  Transportation Needs: Not on file  Physical Activity: Not  on file  Stress: Not on file  Social Connections: Not on file  Intimate Partner Violence: Not on file     Review of Systems: General: negative for chills, fever, night sweats or weight changes.  Cardiovascular: negative for chest pain, dyspnea on exertion, edema, orthopnea, palpitations, paroxysmal nocturnal dyspnea or shortness of breath Dermatological: negative for rash Respiratory: negative for cough or wheezing Urologic: negative for hematuria Abdominal: negative for nausea, vomiting, diarrhea, bright red blood per rectum, melena, or hematemesis Neurologic: negative for visual changes, syncope, or dizziness All other systems reviewed and are otherwise negative except as noted above.    Blood pressure 118/62,  pulse 63, height _0  (1.626 m), weight 130 lb 9.6 oz (59.2 kg).  General appearance: alert and no distress Neck: no adenopathy, no carotid bruit, no JVD, supple, symmetrical, trachea midline, and thyroid not enlarged, symmetric, no tenderness/mass/nodules Lungs: clear to auscultation bilaterally Heart: regular rate and rhythm, S1, S2 normal, no murmur, click, rub or gallop Extremities: extremities normal, atraumatic, no cyanosis or edema Pulses: 2+ and symmetric Skin: Skin color, texture, turgor normal. No rashes or lesions Neurologic: Grossly normal  EKG sinus rhythm at 63 with nonspecific ST and T wave changes.  I personally reviewed this EKG.  ASSESSMENT AND PLAN:   HTN (hypertension) History of essential hypertension blood pressure measured today at 118/62.  She is on hydrochlorothiazide, losartan and metoprolol.  Palpitations History of palpitations which have resolved since her primary care physician changed her seizure medication.  She did wear a Zio patch which resulted 06/18/2019 revealing occasional PACs, PVCs and short runs of SVT and nonsustained ventricular tachycardia all of which have resolved.     Lorretta Harp MD FACP,FACC,FAHA, [facility] 07/14/2020 11:49 AM

## 2020-07-14 NOTE — Patient Instructions (Addendum)
Medication Instructions:  Your Physician recommend you continue on your current medication as directed.    *If you need a refill on your cardiac medications before your next appointment, please call your pharmacy*   Lab Work: None ordered today   Testing/Procedures: None ordered today   Follow-Up: At [facility], you and your health needs are our priority.  As part of our continuing mission to provide you with exceptional heart care, we have created designated Provider Care Teams.  These Care Teams include your primary Cardiologist (physician) and Advanced Practice Providers (APPs -  Physician Assistants and Nurse Practitioners) who all work together to provide you with the care you need, when you need it.  We recommend signing up for the patient portal called "MyChart".  Sign up information is provided on this After Visit Summary.  MyChart is used to connect with patients for Virtual Visits (Telemedicine).  Patients are able to view lab/test results, encounter notes, upcoming appointments, etc.  Non-urgent messages can be sent to your provider as well.   To learn more about what you can do with MyChart, go to [email or web address].    Your next appointment:   As needed  The format for your next appointment:   In Person  Provider:   Jonathan Berry, MD      

## 2020-07-14 NOTE — Assessment & Plan Note (Signed)
History of essential hypertension blood pressure measured today at 118/62.  She is on hydrochlorothiazide, losartan and metoprolol.

## 2020-07-14 NOTE — Assessment & Plan Note (Signed)
History of palpitations which have resolved since her primary care physician changed her seizure medication.  She did wear a Zio patch which resulted 06/18/2019 revealing occasional PACs, PVCs and short runs of SVT and nonsustained ventricular tachycardia all of which have resolved.

## 2020-07-31 IMAGING — DX PORTABLE CHEST - 1 VIEW
1 series · 1 of 1 positions shown · non-contrast
Comparison: None.

CLINICAL DATA: Pt states feeling like heart is racing for 5 days
Order for abnormal ECG

EXAM:
PORTABLE CHEST 1 VIEW

[chest]
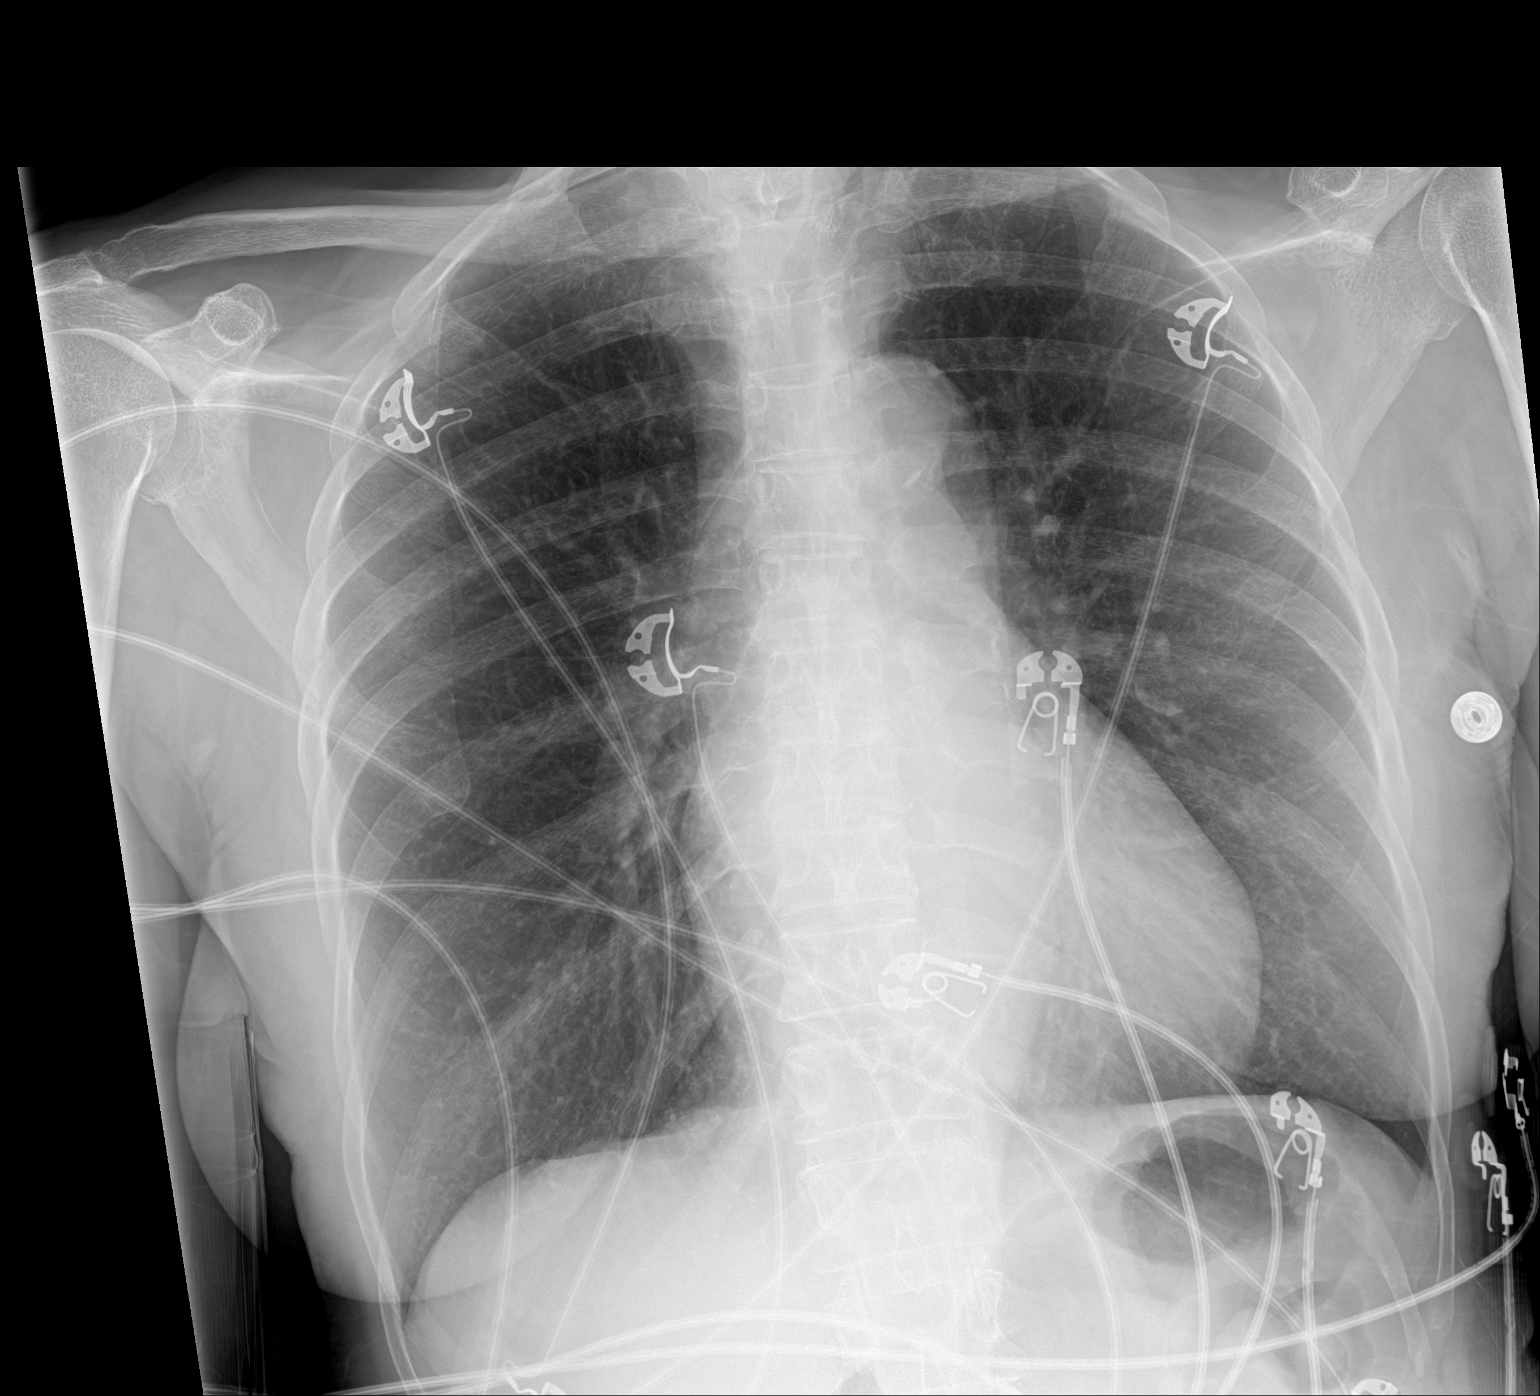

[1 of 1 positions shown; findings below may reference images not displayed]

FINDINGS: The lungs are mildly hyperinflated. Heart size is normal. There is
deviation of the trachea towards the LEFT, cyst suspicious for
enlarged thyroid.

The lungs are clear. No pulmonary edema or consolidation. There is
minimal focal atherosclerotic calcification of the thoracic aorta.
IMPRESSION: 1. Hyperinflation.
2. Suspect enlarged thyroid. Recommend follow-up thyroid ultrasound.
3.  There is atherosclerotic calcification of the thoracic aorta.

## 2020-08-02 IMAGING — DX PORTABLE CHEST - 1 VIEW
1 series · 1 of 1 positions shown · non-contrast
Comparison: July 23, 2018

CLINICAL DATA: Palpitation and night sweats since [REDACTED].

EXAM:
PORTABLE CHEST 1 VIEW

[chest ap]
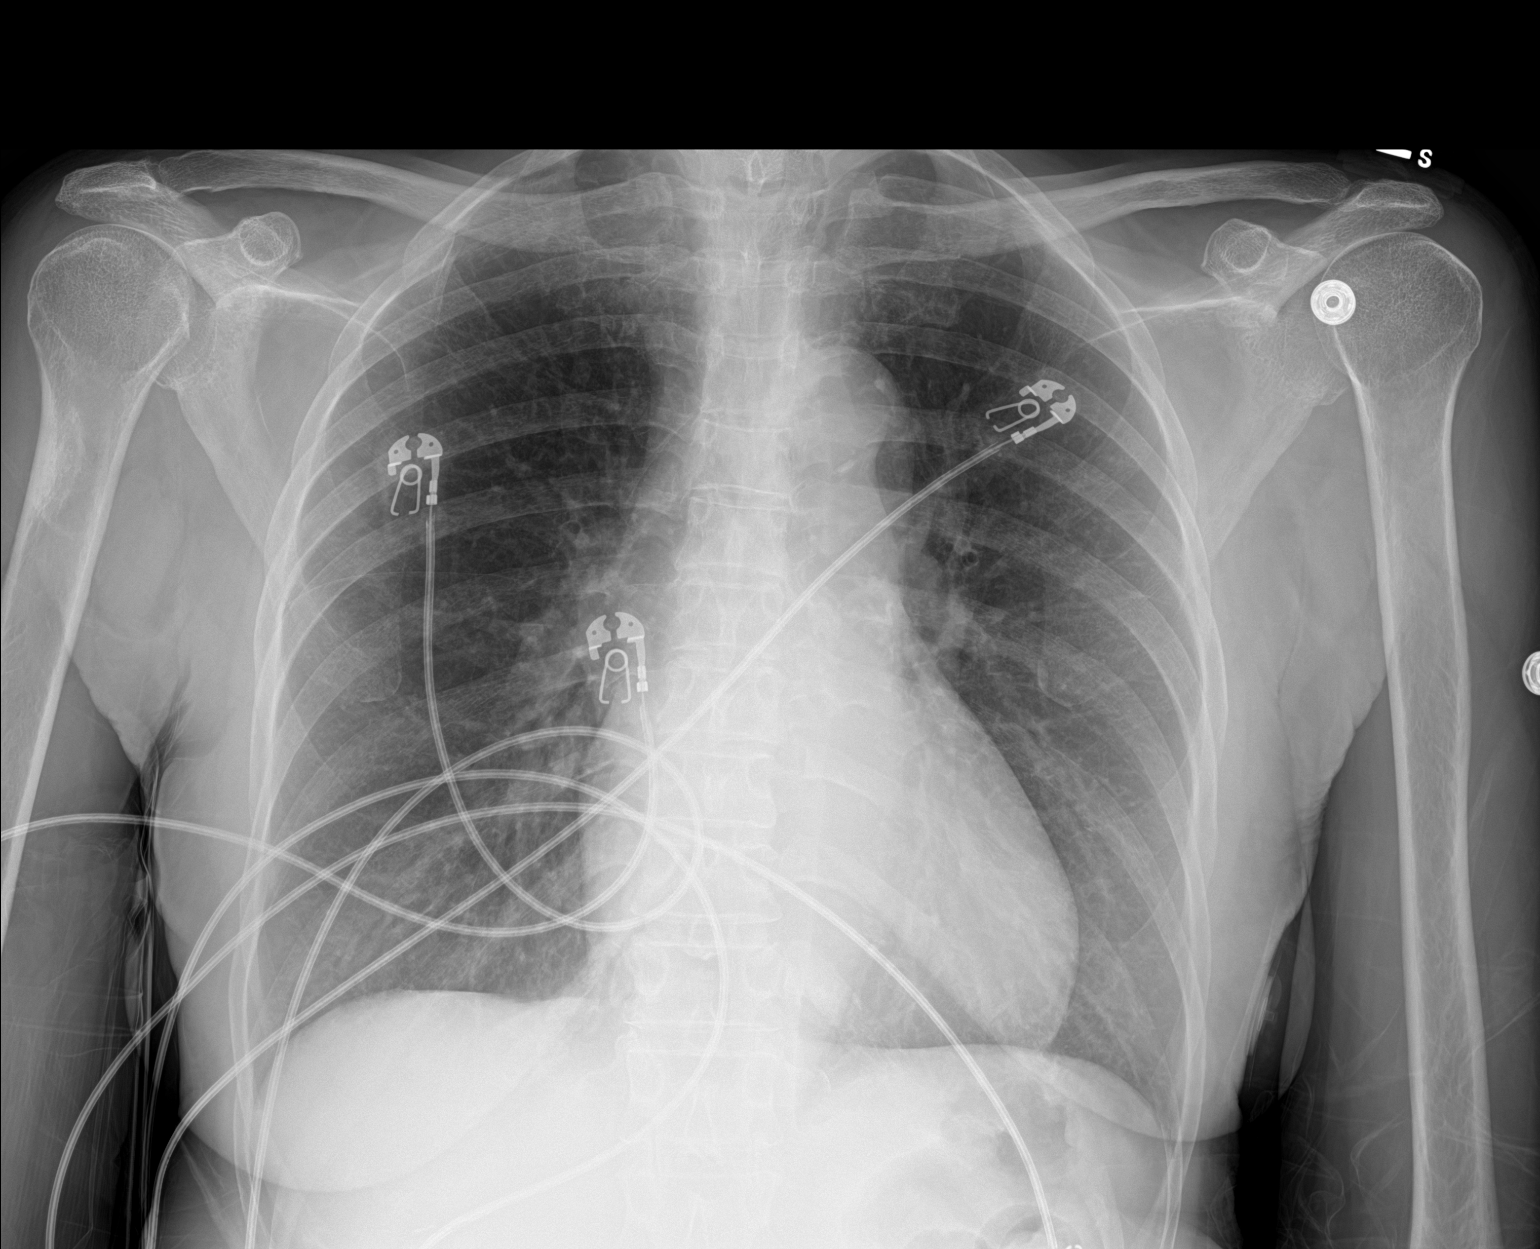

[1 of 1 positions shown; findings below may reference images not displayed]

FINDINGS: The heart size and mediastinal contours are within normal limits.
Both lungs are clear. The visualized skeletal structures are
unremarkable.
IMPRESSION: No active disease.

## 2020-08-18 DIAGNOSIS — E876 Hypokalemia: Secondary | ICD-10-CM | POA: Diagnosis not present

## 2020-08-18 DIAGNOSIS — F419 Anxiety disorder, unspecified: Secondary | ICD-10-CM | POA: Diagnosis not present

## 2020-08-18 DIAGNOSIS — R002 Palpitations: Secondary | ICD-10-CM | POA: Diagnosis not present

## 2020-08-18 DIAGNOSIS — I10 Essential (primary) hypertension: Secondary | ICD-10-CM | POA: Diagnosis not present

## 2020-08-18 DIAGNOSIS — R809 Proteinuria, unspecified: Secondary | ICD-10-CM | POA: Diagnosis not present

## 2020-08-18 DIAGNOSIS — E041 Nontoxic single thyroid nodule: Secondary | ICD-10-CM | POA: Diagnosis not present

## 2020-08-18 DIAGNOSIS — N182 Chronic kidney disease, stage 2 (mild): Secondary | ICD-10-CM | POA: Diagnosis not present

## 2020-08-18 DIAGNOSIS — Z87898 Personal history of other specified conditions: Secondary | ICD-10-CM | POA: Diagnosis not present

## 2020-08-18 DIAGNOSIS — D649 Anemia, unspecified: Secondary | ICD-10-CM | POA: Diagnosis not present

## 2020-08-18 DIAGNOSIS — F324 Major depressive disorder, single episode, in partial remission: Secondary | ICD-10-CM | POA: Diagnosis not present

## 2020-08-18 DIAGNOSIS — E039 Hypothyroidism, unspecified: Secondary | ICD-10-CM | POA: Diagnosis not present

## 2020-10-22 NOTE — Patient Instructions (Signed)
Below is our plan:  We will continue levetiracetam 1500mg  and zonisamide 100mg  twice daly.   Please make sure you are staying well hydrated. I recommend 50-60 ounces daily. Well balanced diet and regular exercise encouraged. Consistent sleep schedule with 6-8 hours recommended.   Please continue follow up with care team as directed.   Follow up with Judson Roch in 1 year   You may receive a survey regarding today's visit. I encourage you to leave honest feed back as I do use this information to improve patient care. Thank you for seeing me today!

## 2020-10-22 NOTE — Progress Notes (Signed)
Chief Complaint  Patient presents with   Follow-up    New rm, alone. Her for 6 month SZ f/u. Pt reports no sz like activity. Overall doing well.      HISTORY OF PRESENT ILLNESS:  10/28/20 ALL:  Maria Livingston is a 75 y.o. female here today for follow up for seizures. She continues levetiracetam 1500mg  BID and zonisamide 100mg  BID. Last GTC seizure 05/2016. Starring spells started in 2020. She deneis any seizure like activity since 02/2020. She is tolerating medicaitons well. She continues regular follow up with PCP. Dr Gwenlyn Found has released her.   04/28/20 SS: [name] is a 75 year old female with history of seizures, typical are generalized tonic-clonic, and in November 2020 started complaining of episodes of staring off.  Has cardiology consult.  EEG has been abnormal.  Her staring spells are described as:   1.  Feels nauseated 2.  Has a lapse in memory  3.  When she comes back around, her heart is racing; there is no shaking, falling to the ground   Remains on Keppra 1500 mg twice a day, Zonegran was added in February 2022, currently taking 100 mg twice daily.  Since the addition of Zonegran, has had no further staring spells.  In general, she is feeling better, has more energy, is walking.  She denies any side effects of the medications.  She is compliant.  Continues to live with her sister, she is not driving.  She is pleased with how well she is doing.  Here today for evaluation unaccompanied.   REVIEW OF SYSTEMS: Out of a complete 14 system review of symptoms, the patient complains only of the following symptoms, none and all other reviewed systems are negative.   ALLERGIES: No Known Allergies   HOME MEDICATIONS: Outpatient Medications Prior to Visit  Medication Sig Dispense Refill   aspirin EC 81 MG tablet Take 81 mg by mouth daily.     hydrochlorothiazide (HYDRODIURIL) 12.5 MG tablet Take 12.5 mg by mouth daily.     levETIRAcetam (KEPPRA) 1000 MG tablet Take 1.5 tablets  twice daily 270 tablet 3   levothyroxine (SYNTHROID) 75 MCG tablet Take 37.5 mcg by mouth daily before breakfast.     losartan (COZAAR) 50 MG tablet Take 50 mg by mouth daily.     metoprolol tartrate (LOPRESSOR) 25 MG tablet Take 1&1/2 tablets twice a day. 180 tablet 3   mirtazapine (REMERON) 15 MG tablet Take 15 mg by mouth at bedtime.     potassium chloride (KLOR-CON) 10 MEQ tablet Take 1 tablet (10 mEq total) by mouth daily. 30 tablet 2   VITAMIN D, CHOLECALCIFEROL, PO Take 50 mg by mouth daily.     zonisamide (ZONEGRAN) 100 MG capsule Take 1 capsule (100 mg total) by mouth 2 (two) times daily. 180 capsule 1   No facility-administered medications prior to visit.     PAST MEDICAL HISTORY: Past Medical History:  Diagnosis Date   Estrogen deficiency    Hypertension    Hypothyroid    Seizure disorder ([place]) 11/29/2017   Seizures ([place])    Vision abnormalities      PAST SURGICAL HISTORY: Past Surgical History:  Procedure Laterality Date   ABDOMINAL HYSTERECTOMY     BREAST CYST EXCISION Bilateral 1970   CYSTECTOMY     bilateral breast   TEE WITHOUT CARDIOVERSION N/A 08/16/2018   Procedure: TRANSESOPHAGEAL ECHOCARDIOGRAM (TEE);  Surgeon: Buford Dresser, MD;  Location: [place];  Service: Cardiovascular;  Laterality: N/A;   THYROID  SURGERY     TOE SURGERY     TONSILLECTOMY       FAMILY HISTORY: Family History  Problem Relation Age of Onset   Osteoarthritis Mother    Hypertension Mother    Hypertension Father    Stroke Father    Hypertension Brother    Prostate cancer Brother      SOCIAL HISTORY: Social History   Socioeconomic History   Marital status: Single    Spouse name: Not on file   Number of children: Not on file   Years of education: Not on file   Highest education level: Not on file  Occupational History   Not on file  Tobacco Use   Smoking status: Former    Packs/day: 1.00    Types: Cigarettes    Quit date: 07/02/2004    Years since  quitting: 16.3   Smokeless tobacco: Never  Substance and Sexual Activity   Alcohol use: Yes    Alcohol/week: 0.0 standard drinks    Comment: 1 glass of wine per day   Drug use: No   Sexual activity: Not Currently  Other Topics Concern   Not on file  Social History Narrative   Not on file   Social Determinants of Health   Financial Resource Strain: Not on file  Food Insecurity: Not on file  Transportation Needs: Not on file  Physical Activity: Not on file  Stress: Not on file  Social Connections: Not on file  Intimate Partner Violence: Not on file     PHYSICAL EXAM  Vitals:   10/28/20 1057  BP: 140/71  Pulse: (!) 59  Weight: 137 lb 3.2 oz (62.2 kg)  Height: 5\' 4"  (1.626 m)   Body mass index is 23.55 kg/m.  Generalized: Well developed, in no acute distress  Cardiology: normal rate and rhythm, no murmur auscultated  Respiratory: clear to auscultation bilaterally    Neurological examination  Mentation: Alert oriented to time, place, history taking. Follows all commands speech and language fluent Cranial nerve II-XII: Pupils were equal round reactive to light. Extraocular movements were full, visual field were full on confrontational test. Facial sensation and strength were normal. Head turning and shoulder shrug  were normal and symmetric. Motor: The motor testing reveals 5 over 5 strength of all 4 extremities. Good symmetric motor tone is noted throughout.  Sensory: Sensory testing is intact to soft touch on all 4 extremities. No evidence of extinction is noted.  Coordination: Cerebellar testing reveals good finger-nose-finger and heel-to-shin bilaterally.  Gait and station: Gait is normal.    DIAGNOSTIC DATA (LABS, IMAGING, TESTING) - I reviewed patient records, labs, notes, testing and imaging myself where available.  Lab Results  Component Value Date   WBC 5.9 04/28/2020   HGB 10.0 (L) 04/28/2020   HCT 29.9 (L) 04/28/2020   MCV 93 04/28/2020   PLT 254  04/28/2020      Component Value Date/Time   NA 145 (H) 04/28/2020 1114   K 4.9 04/28/2020 1114   CL 110 (H) 04/28/2020 1114   CO2 19 (L) 04/28/2020 1114   GLUCOSE 80 04/28/2020 1114   GLUCOSE 90 07/25/2018 0841   BUN 13 04/28/2020 1114   CREATININE 1.09 (H) 04/28/2020 1114   CALCIUM 10.3 04/28/2020 1114   PROT 7.2 04/28/2020 1114   ALBUMIN 4.8 (H) 04/28/2020 1114   AST 20 04/28/2020 1114   ALT 18 04/28/2020 1114   ALKPHOS 67 04/28/2020 1114   BILITOT 0.4 04/28/2020 1114   GFRNONAA 68  08/21/2018 1030   GFRAA 79 08/21/2018 1030   No results found for: CHOL, HDL, LDLCALC, LDLDIRECT, TRIG, CHOLHDL No results found for: HGBA1C No results found for: VITAMINB12 Lab Results  Component Value Date   TSH 1.060 08/21/2018    No flowsheet data found.   No flowsheet data found.   ASSESSMENT AND PLAN  75 y.o. year old female  has a past medical history of Estrogen deficiency, Hypertension, Hypothyroid, Seizure disorder ([place]) (11/29/2017), Seizures ([place]), and Vision abnormalities. here with    Seizure disorder [facility])   Ceil is doing well, today. No seizure activity. She will continue levetiracetam 1500mg  and zonisamide 100mg  twice daily. She will continue healthy lifestyle habits and regular follow up with PCP. She will return to see Judson Roch in 1 year.    No orders of the defined types were placed in this encounter.    No orders of the defined types were placed in this encounter.     Debbora Presto, MSN, FNP-C 10/28/2020, 11:15 AM  [facility] [address] [place], [place] [address] [phone]

## 2020-10-28 ENCOUNTER — Encounter: Payer: Self-pay | Admitting: Family Medicine

## 2020-10-28 ENCOUNTER — Other Ambulatory Visit: Payer: Self-pay

## 2020-10-28 ENCOUNTER — Ambulatory Visit (INDEPENDENT_AMBULATORY_CARE_PROVIDER_SITE_OTHER): Payer: Medicare Other | Admitting: Family Medicine

## 2020-10-28 VITALS — BP 140/71 | HR 59 | Ht 64.0 in | Wt 137.2 lb

## 2020-10-28 DIAGNOSIS — G40909 Epilepsy, unspecified, not intractable, without status epilepticus: Secondary | ICD-10-CM | POA: Diagnosis not present

## 2021-03-04 ENCOUNTER — Telehealth: Payer: Self-pay | Admitting: Family Medicine

## 2021-03-04 MED ORDER — ZONISAMIDE 100 MG PO CAPS: 100.0000 mg | ORAL_CAPSULE | Freq: Two times a day (BID) | ORAL | 2 refills | Status: DC

## 2021-03-04 NOTE — Telephone Encounter (Signed)
E-scribed refill to pharmacy. Next appt: 10/28/21.

## 2021-03-04 NOTE — Telephone Encounter (Addendum)
Pt is requesting a refill for zonisamide (ZONEGRAN) 100 MG capsule. Pt asking Rx to be for 12 months  Pharmacy: [place] [facility]

## 2021-03-16 ENCOUNTER — Telehealth: Payer: Self-pay | Admitting: Family Medicine

## 2021-03-16 MED ORDER — LEVETIRACETAM 1000 MG PO TABS: ORAL_TABLET | ORAL | 2 refills | Status: DC

## 2021-03-16 NOTE — Telephone Encounter (Signed)
Called pt, apologized because we did not receive refill request from pharmacy. She would like refill to go to [facility]. I e-scribed. Reminded her next yearly f/u 10/28/21 11:15am. She verbalized understanding.

## 2021-03-16 NOTE — Telephone Encounter (Signed)
Pt called stating that the pharmacy informed her that they have not heard from [place] about the refill for her levETIRAcetam (KEPPRA) 1000 MG tablet Please resend and inform pt when called in.

## 2021-05-19 DIAGNOSIS — Z0000 Encounter for general adult medical examination without abnormal findings: Secondary | ICD-10-CM | POA: Diagnosis not present

## 2021-05-19 DIAGNOSIS — D649 Anemia, unspecified: Secondary | ICD-10-CM | POA: Diagnosis not present

## 2021-05-19 DIAGNOSIS — R809 Proteinuria, unspecified: Secondary | ICD-10-CM | POA: Diagnosis not present

## 2021-05-19 DIAGNOSIS — E041 Nontoxic single thyroid nodule: Secondary | ICD-10-CM | POA: Diagnosis not present

## 2021-05-19 DIAGNOSIS — E876 Hypokalemia: Secondary | ICD-10-CM | POA: Diagnosis not present

## 2021-05-19 DIAGNOSIS — N182 Chronic kidney disease, stage 2 (mild): Secondary | ICD-10-CM | POA: Diagnosis not present

## 2021-05-19 DIAGNOSIS — Z87898 Personal history of other specified conditions: Secondary | ICD-10-CM | POA: Diagnosis not present

## 2021-05-19 DIAGNOSIS — E039 Hypothyroidism, unspecified: Secondary | ICD-10-CM | POA: Diagnosis not present

## 2021-05-19 DIAGNOSIS — I10 Essential (primary) hypertension: Secondary | ICD-10-CM | POA: Diagnosis not present

## 2021-05-24 DIAGNOSIS — E039 Hypothyroidism, unspecified: Secondary | ICD-10-CM | POA: Diagnosis not present

## 2021-05-24 DIAGNOSIS — I10 Essential (primary) hypertension: Secondary | ICD-10-CM | POA: Diagnosis not present

## 2021-05-24 DIAGNOSIS — D649 Anemia, unspecified: Secondary | ICD-10-CM | POA: Diagnosis not present

## 2021-05-24 DIAGNOSIS — R718 Other abnormality of red blood cells: Secondary | ICD-10-CM | POA: Diagnosis not present

## 2021-06-15 IMAGING — MG DIGITAL SCREENING BILAT W/ TOMO W/ CAD
8 series · 9 of 24 positions shown · non-contrast
Comparison: Previous exam(s).

CLINICAL DATA: Screening.

EXAM:
DIGITAL SCREENING BILATERAL MAMMOGRAM WITH TOMO AND CAD

[L MLO synth-2D]
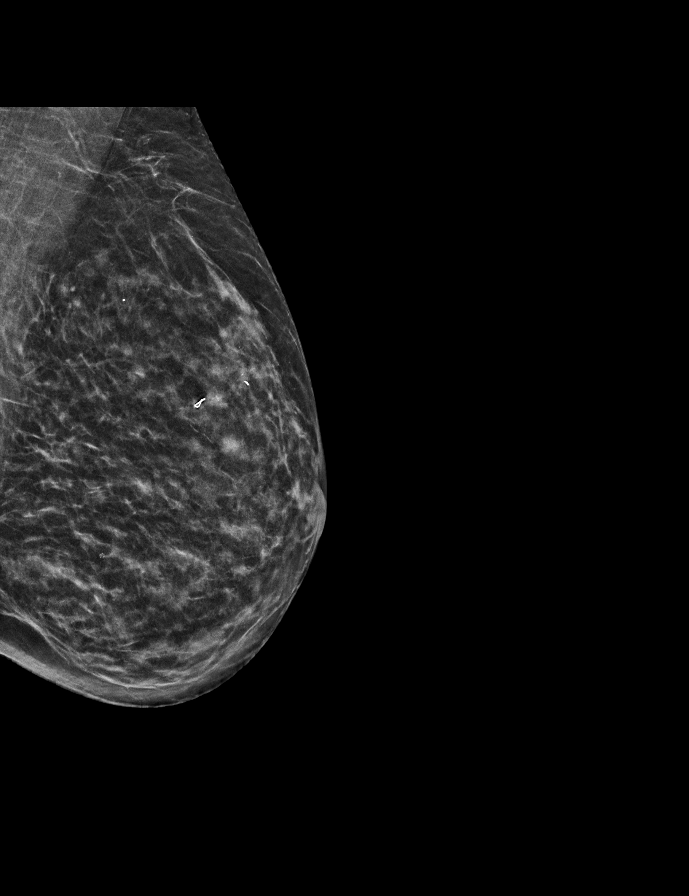

[L CC synth-2D]
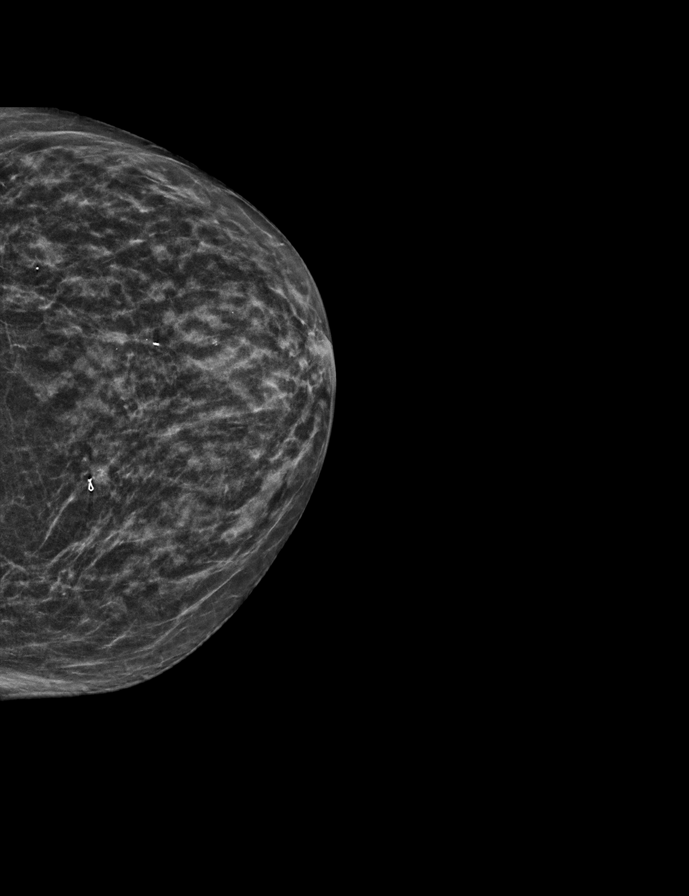

[R CC synth-2D]
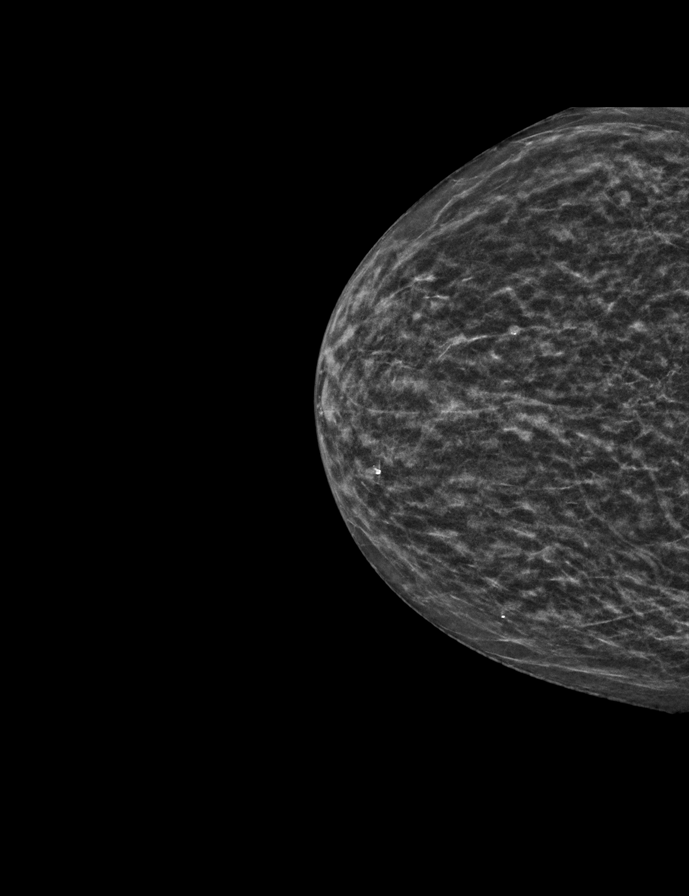

[R MLO synth-2D]
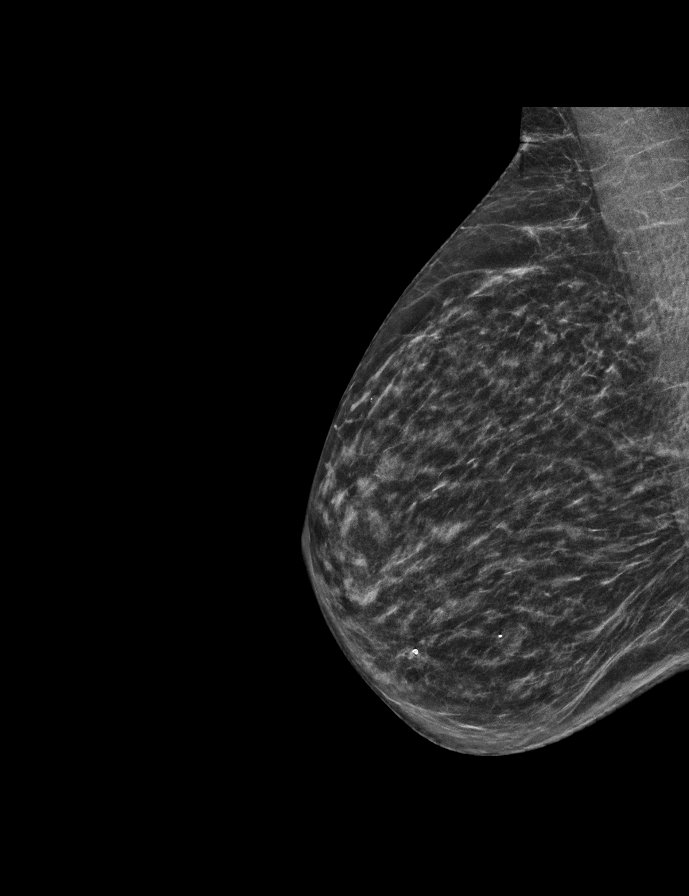

[L MLO tomo · 2 of 45 frames shown]
[frame 15/45]
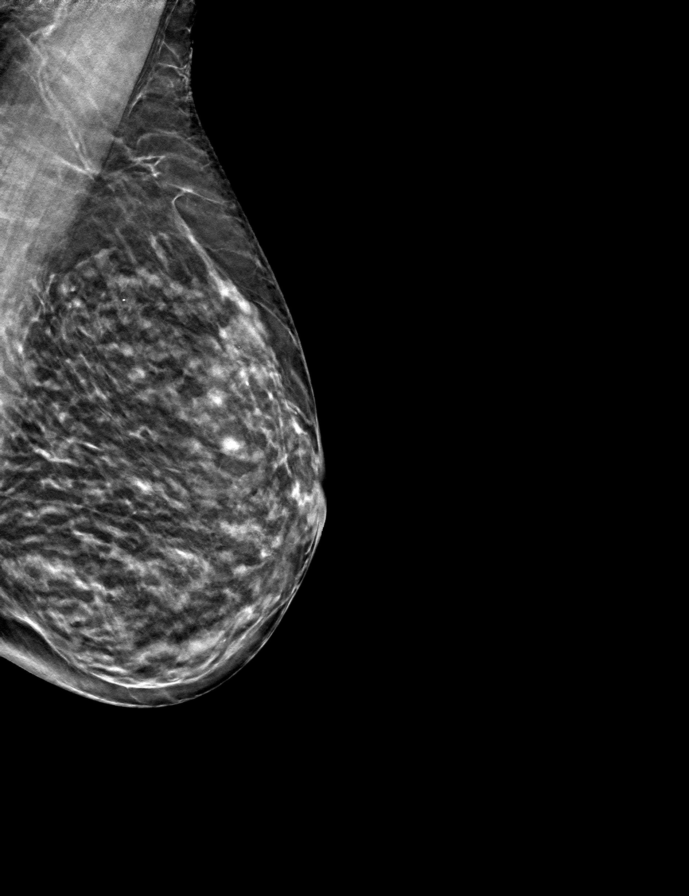
[frame 23/45]
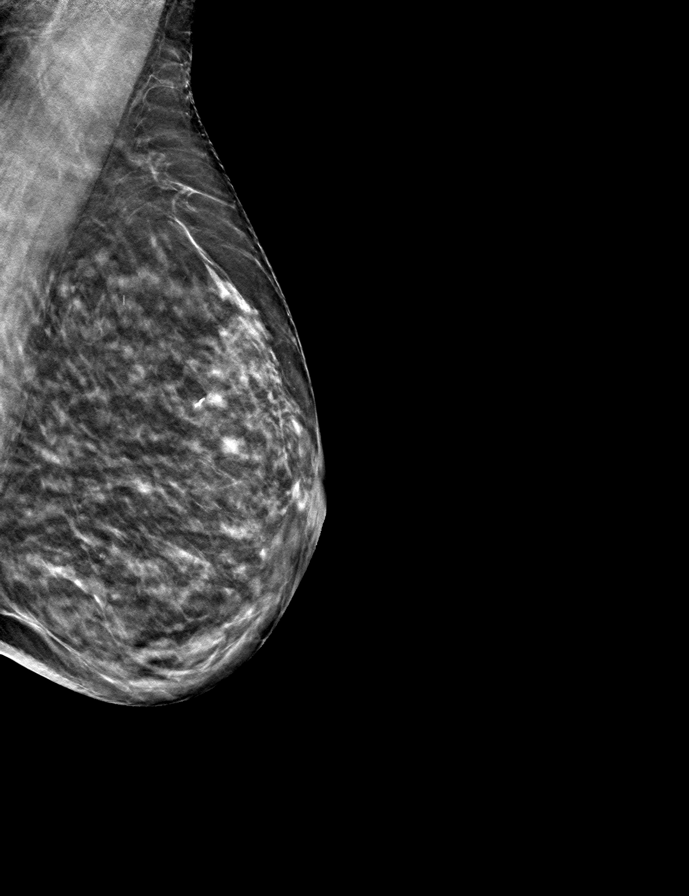

[R CC tomo · tomo slice 19/37.0]
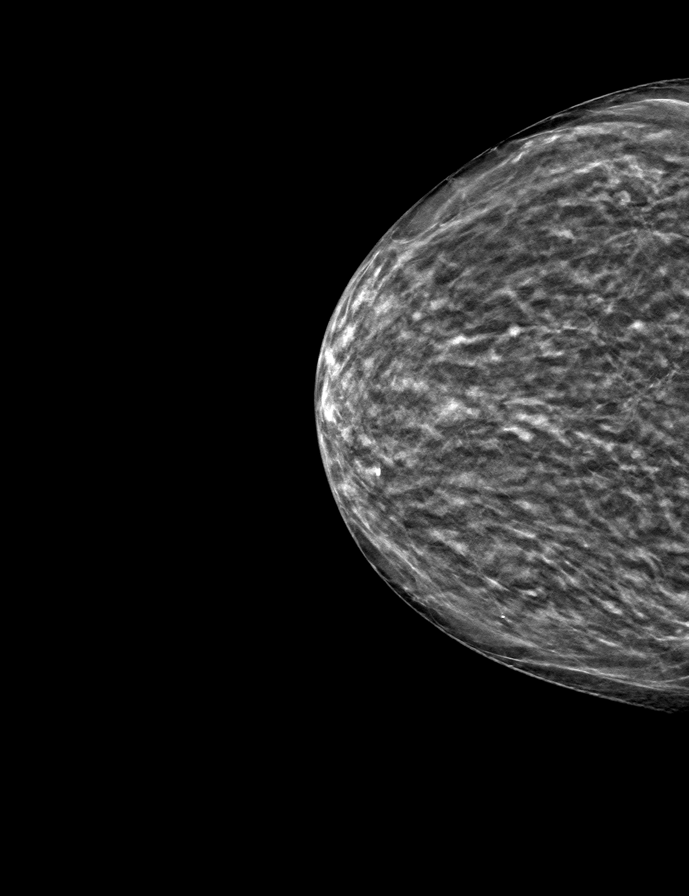

[R MLO tomo · tomo slice 23/45.0]
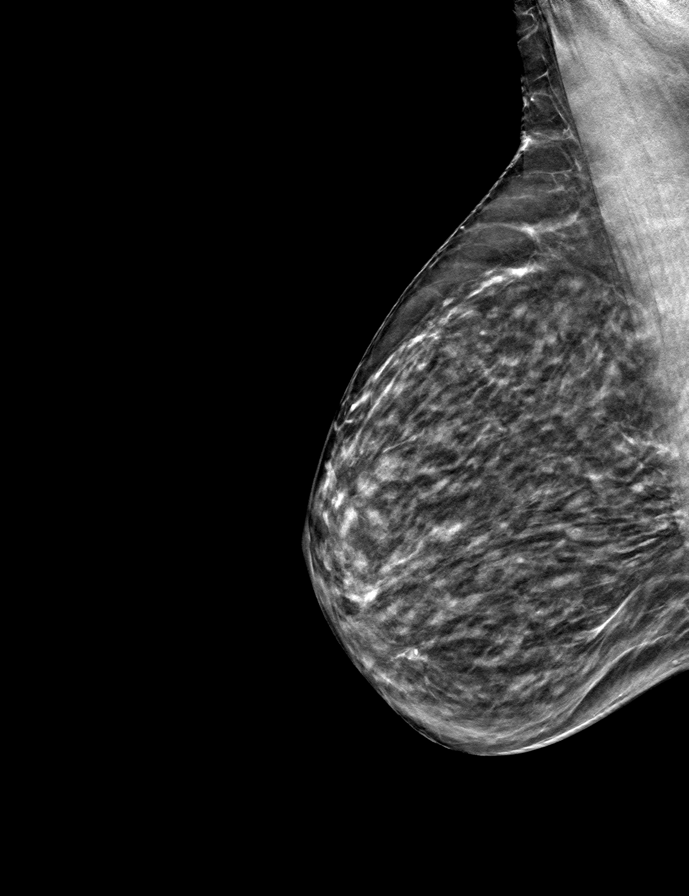

[L CC tomo · tomo slice 21/41.0]
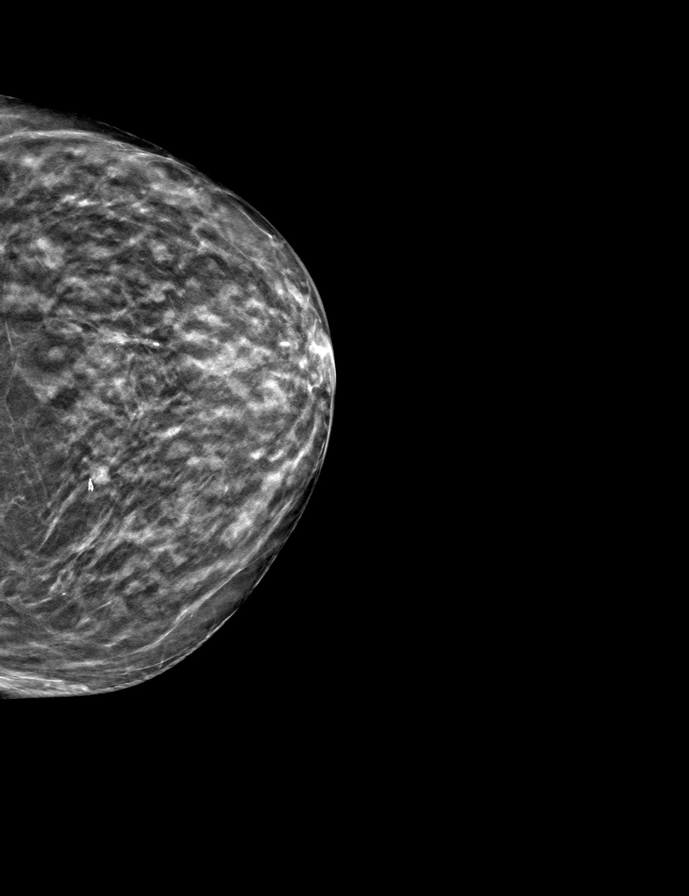

[9 of 24 positions shown; findings below may reference images not displayed]

ACR Breast Density Category c: The breast tissue is heterogeneously
dense, which may obscure small masses.
FINDINGS: There are no findings suspicious for malignancy. Images were
processed with CAD.
IMPRESSION: No mammographic evidence of malignancy. A result letter of this
screening mammogram will be mailed directly to the patient.

RECOMMENDATION:
Screening mammogram in one year. (Code:[id number])

BI-RADS CATEGORY  1: Negative.

## 2021-10-26 ENCOUNTER — Other Ambulatory Visit: Payer: Self-pay

## 2021-10-26 ENCOUNTER — Telehealth: Payer: Self-pay | Admitting: Family Medicine

## 2021-10-26 MED ORDER — LEVETIRACETAM 1000 MG PO TABS: ORAL_TABLET | ORAL | 2 refills | Status: DC

## 2021-10-26 NOTE — Telephone Encounter (Signed)
Tried calling pt. Could not LVM.  We last sent prescription to pharmacy 03/16/21 #270, 2 refills (9 month supply). Should last until 12/14/21. Refills should be on file/pt not due for refill yet.

## 2021-10-26 NOTE — Telephone Encounter (Signed)
Pt request refill for levETIRAcetam (KEPPRA) 1000 MG tablet at [facility]

## 2021-10-27 NOTE — Progress Notes (Deleted)
PATIENT: Maria Livingston DOB: [birth year]  REASON FOR VISIT: follow up HISTORY FROM: patient PRIMARY NEUROLOGIST:    HISTORY OF PRESENT ILLNESS: Today 10/27/21   Update 04/28/20 SS: Maria Livingston is a 76 year old female with history of seizures, typical are generalized tonic-clonic, and in November 2020 started complaining of episodes of staring off.  Has cardiology consult.  EEG has been abnormal.  Her staring spells are described as:  1.  Feels nauseated 2.  Has a lapse in memory  3.  When she comes back around, her heart is racing; there is no shaking, falling to the ground  Remains on Keppra 1500 mg twice a day, Zonegran was added in February 2022, currently taking 100 mg twice daily.  Since the addition of Zonegran, has had no further staring spells.  In general, she is feeling better, has more energy, is walking.  She denies any side effects of the medications.  She is compliant.  Continues to live with her sister, she is not driving.  She is pleased with how well she is doing.  Here today for evaluation unaccompanied.  Update 12/09/2019 SS: Maria Livingston is a 76 year old female with history of seizures since 2013. Typical seizures are generalized tonic-clonic, last in May 2018, back in Nov 2020 reported episodes of staring off, Keppra was increased 750 mg twice daily. Saw cardiology last year, wore a heart monitor in July 2020, nonsustained V. tach and palpitations, was started on metoprolol. Had EEG in June 2021, abnormal, evidence of mild dysrhythmic slow activity in the right hemisphere, with frequent sharp transient involving the right hemisphere frontal, temporal, parietal region, indicating focal irritability. Wore heart monitor again in June 2021, short runs of SVT and NSVT, metoprolol increased 25 mg, 1.5 tablets twice a day.  Continues to report about once daily spells, where time will stop, does not know what she is doing, last only a few seconds, then back to baseline.  Lives with  her sister, has not noticed ever. She thinks the higher dose metoprolol was helpful, but is not sure. She does seem to stop what she is doing when the spells occur.  Feels nauseated before the spells, feels her heart is racing afterwards.  Remains on Keppra 750 mg twice daily.  Here today for follow-up unaccompanied.  HISTORY 06/10/2019 SS: Maria Livingston is a 76 year old female history of seizures since 2013.  Her typical seizures are generalized tonic-clonic (last in May 2018), when last seen, reported episodes of staring off.  Keppra was increased 750 mg twice a day.  Continues to report these episodes a few times a week, did not notice any change with higher dose of Keppra.  Describes them as staring off/zoning out lasting for a few seconds only, able to continue function, afterwards feels her heart racing/palpitations. She says no one would notice them, she knows because sense of loss of time. No loss of vision.  Had cardiology evaluation last year, wore a heart monitor in July, nonsustained V. tach and palpitations, she was started on metoprolol.  She is not clear if she was having the staring episodes at that time.  Is compliant medications. Presents today for evaluation unaccompanied.  REVIEW OF SYSTEMS: Out of a complete 14 system review of symptoms, the patient complains only of the following symptoms, and all other reviewed systems are negative.  Seizures  ALLERGIES: No Known Allergies  HOME MEDICATIONS: Outpatient Medications Prior to Visit  Medication Sig Dispense Refill   aspirin EC 81 MG tablet Take  81 mg by mouth daily.     hydrochlorothiazide (HYDRODIURIL) 12.5 MG tablet Take 12.5 mg by mouth daily.     levETIRAcetam (KEPPRA) 1000 MG tablet Take 1.5 tablets twice daily 270 tablet 2   levothyroxine (SYNTHROID) 75 MCG tablet Take 37.5 mcg by mouth daily before breakfast.     losartan (COZAAR) 50 MG tablet Take 50 mg by mouth daily.     metoprolol tartrate (LOPRESSOR) 25 MG tablet Take  1&1/2 tablets twice a day. 180 tablet 3   mirtazapine (REMERON) 15 MG tablet Take 15 mg by mouth at bedtime.     potassium chloride (KLOR-CON) 10 MEQ tablet Take 1 tablet (10 mEq total) by mouth daily. 30 tablet 2   VITAMIN D, CHOLECALCIFEROL, PO Take 50 mg by mouth daily.     zonisamide (ZONEGRAN) 100 MG capsule Take 1 capsule (100 mg total) by mouth 2 (two) times daily. 180 capsule 2   No facility-administered medications prior to visit.    PAST MEDICAL HISTORY: Past Medical History:  Diagnosis Date   Estrogen deficiency    Hypertension    Hypothyroid    Seizure disorder ([place]) 11/29/2017   Seizures ([place])    Vision abnormalities     PAST SURGICAL HISTORY: Past Surgical History:  Procedure Laterality Date   ABDOMINAL HYSTERECTOMY     BREAST CYST EXCISION Bilateral 1970   CYSTECTOMY     bilateral breast   TEE WITHOUT CARDIOVERSION N/A 08/16/2018   Procedure: TRANSESOPHAGEAL ECHOCARDIOGRAM (TEE);  Surgeon: Buford Dresser, MD;  Location: [facility] ENDOSCOPY;  Service: Cardiovascular;  Laterality: N/A;   THYROID SURGERY     TOE SURGERY     TONSILLECTOMY      FAMILY HISTORY: Family History  Problem Relation Age of Onset   Osteoarthritis Mother    Hypertension Mother    Hypertension Father    Stroke Father    Hypertension Brother    Prostate cancer Brother     SOCIAL HISTORY: Social History   Socioeconomic History   Marital status: Single    Spouse name: Not on file   Number of children: Not on file   Years of education: Not on file   Highest education level: Not on file  Occupational History   Not on file  Tobacco Use   Smoking status: Former    Packs/day: 1.00    Types: Cigarettes    Quit date: 07/02/2004    Years since quitting: 17.3   Smokeless tobacco: Never  Substance and Sexual Activity   Alcohol use: Yes    Alcohol/week: 0.0 standard drinks of alcohol    Comment: 1 glass of wine per day   Drug use: No   Sexual activity: Not Currently  Other Topics  Concern   Not on file  Social History Narrative   Not on file   Social Determinants of Health   Financial Resource Strain: Not on file  Food Insecurity: Not on file  Transportation Needs: Not on file  Physical Activity: Not on file  Stress: Not on file  Social Connections: Not on file  Intimate Partner Violence: Not on file   PHYSICAL EXAM  There were no vitals filed for this visit. There is no height or weight on file to calculate BMI.  Generalized: Well developed, in no acute distress   Neurological examination  Mentation: Alert oriented to time, place, history taking. Follows all commands speech and language fluent Cranial nerve II-XII: Pupils were equal round reactive to light. Extraocular movements were full, visual  field were full on confrontational test. Facial sensation and strength were normal. Head turning and shoulder shrug  were normal and symmetric. Motor: The motor testing reveals 5 over 5 strength of all 4 extremities. Good symmetric motor tone is noted throughout.  Sensory: Sensory testing is intact to soft touch on all 4 extremities. No evidence of extinction is noted.  Coordination: Cerebellar testing reveals good finger-nose-finger and heel-to-shin bilaterally.  Gait and station: Gait is normal.  Tandem gait is normal. Reflexes: Deep tendon reflexes are symmetric and normal bilaterally.   DIAGNOSTIC DATA (LABS, IMAGING, TESTING) - I reviewed patient records, labs, notes, testing and imaging myself where available.  Lab Results  Component Value Date   WBC 5.9 04/28/2020   HGB 10.0 (L) 04/28/2020   HCT 29.9 (L) 04/28/2020   MCV 93 04/28/2020   PLT 254 04/28/2020      Component Value Date/Time   NA 145 (H) 04/28/2020 1114   K 4.9 04/28/2020 1114   CL 110 (H) 04/28/2020 1114   CO2 19 (L) 04/28/2020 1114   GLUCOSE 80 04/28/2020 1114   GLUCOSE 90 07/25/2018 0841   BUN 13 04/28/2020 1114   CREATININE 1.09 (H) 04/28/2020 1114   CALCIUM 10.3 04/28/2020  1114   PROT 7.2 04/28/2020 1114   ALBUMIN 4.8 (H) 04/28/2020 1114   AST 20 04/28/2020 1114   ALT 18 04/28/2020 1114   ALKPHOS 67 04/28/2020 1114   BILITOT 0.4 04/28/2020 1114   GFRNONAA 68 08/21/2018 1030   GFRAA 79 08/21/2018 1030   No results found for: "CHOL", "HDL", "LDLCALC", "LDLDIRECT", "TRIG", "CHOLHDL" No results found for: "HGBA1C" No results found for: "VITAMINB12" Lab Results  Component Value Date   TSH 1.060 08/21/2018    ASSESSMENT AND PLAN 76 y.o. year old female  has a past medical history of Estrogen deficiency, Hypertension, Hypothyroid, Seizure disorder ([place]) (11/29/2017), Seizures ([place]), and Vision abnormalities. here with:  1.  Seizure -Typical tonic-clonic seizure, last in May 2018 -In November 2020, started to complain of episodes of staring off, like time stops, feeling nauseated before, palpitations after -Has worn heart monitor twice, last in July 2021, showed short runs of SVT & NSVT, occasional PACs, PVCs, SR/SB/ST, metoprolol was increased -EEG abnormal in June 2021, evidence of mild dysrhythmic slow activity involving right hemisphere with frequent sharp transient involving right hemisphere frontal, temporal, parietal region, indicating focal irritability -Zonegran was added in February 2022, since then no further staring spells, tolerates medications well -Continue Zonegran 100 mg twice daily -Continue Keppra 1000 mg, 1.5 tablets twice daily -Sees Dr. Gwenlyn Found for follow-up in June 2022 -Recommend, she refrain from driving, given loss of function during spells, [place] law no driving until seizure-free for 6 months -Check routine labs today, seizure levels -Call for seizure activity, otherwise follow-up 6 months or sooner if needed  I spent 30 minutes of face-to-face and non-face-to-face time with patient.  This included previsit chart review, lab review, study review, order entry, electronic health record documentation, patient education.  Butler Denmark,  AGNP-C, DNP 10/27/2021, 8:37 AM [facility] [address], [place], [place] [address] [phone]

## 2021-10-28 ENCOUNTER — Encounter: Payer: Self-pay | Admitting: Neurology

## 2021-10-28 ENCOUNTER — Ambulatory Visit: Payer: Medicare Other | Admitting: Neurology

## 2021-10-28 VITALS — BP 134/71 | HR 73 | Ht 64.0 in | Wt 111.0 lb

## 2021-10-28 DIAGNOSIS — G40909 Epilepsy, unspecified, not intractable, without status epilepticus: Secondary | ICD-10-CM | POA: Diagnosis not present

## 2021-10-28 DIAGNOSIS — Z87898 Personal history of other specified conditions: Secondary | ICD-10-CM | POA: Diagnosis not present

## 2021-10-28 DIAGNOSIS — I10 Essential (primary) hypertension: Secondary | ICD-10-CM | POA: Diagnosis not present

## 2021-10-28 DIAGNOSIS — Z13228 Encounter for screening for other metabolic disorders: Secondary | ICD-10-CM | POA: Diagnosis not present

## 2021-10-28 DIAGNOSIS — R809 Proteinuria, unspecified: Secondary | ICD-10-CM | POA: Diagnosis not present

## 2021-10-28 DIAGNOSIS — E039 Hypothyroidism, unspecified: Secondary | ICD-10-CM | POA: Diagnosis not present

## 2021-10-28 DIAGNOSIS — N182 Chronic kidney disease, stage 2 (mild): Secondary | ICD-10-CM | POA: Diagnosis not present

## 2021-10-28 DIAGNOSIS — R7989 Other specified abnormal findings of blood chemistry: Secondary | ICD-10-CM | POA: Diagnosis not present

## 2021-10-28 DIAGNOSIS — D649 Anemia, unspecified: Secondary | ICD-10-CM | POA: Diagnosis not present

## 2021-10-28 DIAGNOSIS — Z79899 Other long term (current) drug therapy: Secondary | ICD-10-CM | POA: Diagnosis not present

## 2021-10-28 DIAGNOSIS — R6889 Other general symptoms and signs: Secondary | ICD-10-CM | POA: Diagnosis not present

## 2021-10-28 DIAGNOSIS — E041 Nontoxic single thyroid nodule: Secondary | ICD-10-CM | POA: Diagnosis not present

## 2021-10-28 MED ORDER — ZONISAMIDE 100 MG PO CAPS: 100.0000 mg | ORAL_CAPSULE | Freq: Two times a day (BID) | ORAL | 3 refills | Status: DC

## 2021-10-28 MED ORDER — LEVETIRACETAM 1000 MG PO TABS: ORAL_TABLET | ORAL | 3 refills | Status: DC

## 2021-10-28 NOTE — Patient Instructions (Signed)
Check labs Need to monitor your weight, recommend discussing with your primary care doctor, may need to get back on Remeron  Weight loss could be partly related to Zonegran but I think other factors are going on as well, try to eat more  For now continue the Zonegran and [place] like to see you back in 6 months to check on your weight  Meds ordered this encounter  Medications   zonisamide (ZONEGRAN) 100 MG capsule    Sig: Take 1 capsule (100 mg total) by mouth 2 (two) times daily.    Dispense:  180 capsule    Refill:  3   levETIRAcetam (KEPPRA) 1000 MG tablet    Sig: Take 1.5 tablets twice daily    Dispense:  270 tablet    Refill:  3

## 2021-10-28 NOTE — Progress Notes (Signed)
PATIENT: Maria Livingston DOB: June 22, 1945  REASON FOR VISIT: follow up HISTORY FROM: patient PRIMARY NEUROLOGIST: Willis/Camara  HISTORY OF PRESENT ILLNESS: Today 10/28/21 Maria Livingston is here today for follow-up.  Remains on Keppra 1500 mg twice a day, Zonegran 100 mg twice a day.  No further seizure spells since Zonegran was added in February 2022. Mentions weight loss, has lost about 25 lbs in the last year, is not eating enough, doesn't like to cook, lives alone. Mentions a lot of stress in her life, with passing of several family members in tragic situations. Worry over a personal situation.  Has been on Remeron for several years, has stopped within the last year.  Is seeing PCP today.  No concerns or questions.  Update 04/28/20 SS: Maria Livingston is a 76 year old female with history of seizures, typical are generalized tonic-clonic, and in November 2020 started complaining of episodes of staring off.  Has cardiology consult.  EEG has been abnormal.  Her staring spells are described as:  1.  Feels nauseated 2.  Has a lapse in memory  3.  When she comes back around, her heart is racing; there is no shaking, falling to the ground  Remains on Keppra 1500 mg twice a day, Zonegran was added in February 2022, currently taking 100 mg twice daily.  Since the addition of Zonegran, has had no further staring spells.  In general, she is feeling better, has more energy, is walking.  She denies any side effects of the medications.  She is compliant.  Continues to live with her sister, she is not driving.  She is pleased with how well she is doing.  Here today for evaluation unaccompanied.  Update 12/09/2019 SS: Maria Livingston is a 76 year old female with history of seizures since 2013. Typical seizures are generalized tonic-clonic, last in May 2018, back in Nov 2020 reported episodes of staring off, Keppra was increased 750 mg twice daily. Saw cardiology last year, wore a heart monitor in July 2020, nonsustained V.  tach and palpitations, was started on metoprolol. Had EEG in June 2021, abnormal, evidence of mild dysrhythmic slow activity in the right hemisphere, with frequent sharp transient involving the right hemisphere frontal, temporal, parietal region, indicating focal irritability. Wore heart monitor again in June 2021, short runs of SVT and NSVT, metoprolol increased 25 mg, 1.5 tablets twice a day.  Continues to report about once daily spells, where time will stop, does not know what she is doing, last only a few seconds, then back to baseline.  Lives with her sister, has not noticed ever. She thinks the higher dose metoprolol was helpful, but is not sure. She does seem to stop what she is doing when the spells occur.  Feels nauseated before the spells, feels her heart is racing afterwards.  Remains on Keppra 750 mg twice daily.  Here today for follow-up unaccompanied.  HISTORY 06/10/2019 SS: Maria Livingston is a 76 year old female history of seizures since 2013.  Her typical seizures are generalized tonic-clonic (last in May 2018), when last seen, reported episodes of staring off.  Keppra was increased 750 mg twice a day.  Continues to report these episodes a few times a week, did not notice any change with higher dose of Keppra.  Describes them as staring off/zoning out lasting for a few seconds only, able to continue function, afterwards feels her heart racing/palpitations. She says no one would notice them, she knows because sense of loss of time. No loss of vision.  Had cardiology  evaluation last year, wore a heart monitor in July, nonsustained V. tach and palpitations, she was started on metoprolol.  She is not clear if she was having the staring episodes at that time.  Is compliant medications. Presents today for evaluation unaccompanied.  REVIEW OF SYSTEMS: Out of a complete 14 system review of symptoms, the patient complains only of the following symptoms, and all other reviewed systems are negative.  See  HPI  ALLERGIES: No Known Allergies  HOME MEDICATIONS: Outpatient Medications Prior to Visit  Medication Sig Dispense Refill   aspirin EC 81 MG tablet Take 81 mg by mouth daily.     hydrochlorothiazide (HYDRODIURIL) 12.5 MG tablet Take 12.5 mg by mouth daily.     levETIRAcetam (KEPPRA) 1000 MG tablet Take 1.5 tablets twice daily 270 tablet 2   levothyroxine (SYNTHROID) 75 MCG tablet Take 37.5 mcg by mouth daily before breakfast.     losartan (COZAAR) 50 MG tablet Take 50 mg by mouth daily.     metoprolol tartrate (LOPRESSOR) 25 MG tablet Take 1&1/2 tablets twice a day. 180 tablet 3   VITAMIN D, CHOLECALCIFEROL, PO Take 50 mg by mouth daily.     zonisamide (ZONEGRAN) 100 MG capsule Take 1 capsule (100 mg total) by mouth 2 (two) times daily. 180 capsule 2   mirtazapine (REMERON) 15 MG tablet Take 15 mg by mouth at bedtime. (Patient not taking: Reported on 10/28/2021)     potassium chloride (KLOR-CON) 10 MEQ tablet Take 1 tablet (10 mEq total) by mouth daily. 30 tablet 2   mirtazapine (REMERON) 15 MG tablet Take 15 mg by mouth at bedtime.     No facility-administered medications prior to visit.    PAST MEDICAL HISTORY: Past Medical History:  Diagnosis Date   Estrogen deficiency    Hypertension    Hypothyroid    Seizure disorder ([place]) 11/29/2017   Seizures ([place])    Vision abnormalities     PAST SURGICAL HISTORY: Past Surgical History:  Procedure Laterality Date   ABDOMINAL HYSTERECTOMY     BREAST CYST EXCISION Bilateral 1970   CYSTECTOMY     bilateral breast   TEE WITHOUT CARDIOVERSION N/A 08/16/2018   Procedure: TRANSESOPHAGEAL ECHOCARDIOGRAM (TEE);  Surgeon: Buford Dresser, MD;  Location: [facility] ENDOSCOPY;  Service: Cardiovascular;  Laterality: N/A;   THYROID SURGERY     TOE SURGERY     TONSILLECTOMY      FAMILY HISTORY: Family History  Problem Relation Age of Onset   Osteoarthritis Mother    Hypertension Mother    Hypertension Father    Stroke Father     Hypertension Brother    Prostate cancer Brother     SOCIAL HISTORY: Social History   Socioeconomic History   Marital status: Single    Spouse name: Not on file   Number of children: Not on file   Years of education: Not on file   Highest education level: Not on file  Occupational History   Not on file  Tobacco Use   Smoking status: Former    Packs/day: 1.00    Types: Cigarettes    Quit date: 07/02/2004    Years since quitting: 17.3   Smokeless tobacco: Never  Substance and Sexual Activity   Alcohol use: Yes    Alcohol/week: 0.0 standard drinks of alcohol    Comment: 1 glass of wine per day   Drug use: No   Sexual activity: Not Currently  Other Topics Concern   Not on file  Social History Narrative  Not on file   Social Determinants of Health   Financial Resource Strain: Not on file  Food Insecurity: Not on file  Transportation Needs: Not on file  Physical Activity: Not on file  Stress: Not on file  Social Connections: Not on file  Intimate Partner Violence: Not on file   PHYSICAL EXAM  Vitals:   10/28/21 0818  BP: 134/71  Pulse: 73  Weight: 111 lb (50.3 kg)  Height: '5\' 4"'$  (1.626 m)   Body mass index is 19.05 kg/m.  Generalized: Well developed, in no acute distress   Neurological examination  Mentation: Alert oriented to time, place, history taking. Follows all commands speech and language fluent Cranial nerve II-XII: Pupils were equal round reactive to light. Extraocular movements were full, visual field were full on confrontational test. Facial sensation and strength were normal. Head turning and shoulder shrug  were normal and symmetric. Motor: The motor testing reveals 5 over 5 strength of all 4 extremities. Good symmetric motor tone is noted throughout.  Sensory: Sensory testing is intact to soft touch on all 4 extremities. No evidence of extinction is noted.  Coordination: Cerebellar testing reveals good finger-nose-finger and heel-to-shin  bilaterally.  Gait and station: Gait is normal.  Tandem gait is normal. Reflexes: Deep tendon reflexes are symmetric and normal bilaterally.   DIAGNOSTIC DATA (LABS, IMAGING, TESTING) - I reviewed patient records, labs, notes, testing and imaging myself where available.  Lab Results  Component Value Date   WBC 5.9 04/28/2020   HGB 10.0 (L) 04/28/2020   HCT 29.9 (L) 04/28/2020   MCV 93 04/28/2020   PLT 254 04/28/2020      Component Value Date/Time   NA 145 (H) 04/28/2020 1114   K 4.9 04/28/2020 1114   CL 110 (H) 04/28/2020 1114   CO2 19 (L) 04/28/2020 1114   GLUCOSE 80 04/28/2020 1114   GLUCOSE 90 07/25/2018 0841   BUN 13 04/28/2020 1114   CREATININE 1.09 (H) 04/28/2020 1114   CALCIUM 10.3 04/28/2020 1114   PROT 7.2 04/28/2020 1114   ALBUMIN 4.8 (H) 04/28/2020 1114   AST 20 04/28/2020 1114   ALT 18 04/28/2020 1114   ALKPHOS 67 04/28/2020 1114   BILITOT 0.4 04/28/2020 1114   GFRNONAA 68 08/21/2018 1030   GFRAA 79 08/21/2018 1030   No results found for: "CHOL", "HDL", "LDLCALC", "LDLDIRECT", "TRIG", "CHOLHDL" No results found for: "HGBA1C" No results found for: "VITAMINB12" Lab Results  Component Value Date   TSH 1.060 08/21/2018    ASSESSMENT AND PLAN 76 y.o. year old female  has a past medical history of Estrogen deficiency, Hypertension, Hypothyroid, Seizure disorder ([place]) (11/29/2017), Seizures ([place]), and Vision abnormalities. here with:  1.  Seizure disorder 2.  Weight loss  -Maria Livingston has been under a lot of stress lately for variety of reasons, as result has lost about 25 pounds in the last year, stopped Remeron during that time -Seen PCP later today, recommended to discuss this, needs to eats more, consider restarting Remeron may be good for the mood/stress, Zonegran may have some contribution to the weight loss, but I do not think is the sole factor -Check routine labs today including TSH, seizure levels -For now, continue Zonegran 100 mg twice a day, Keppra  1500 mg twice a day  -Last tonic-clonic seizure was in May 2018 -In November 2020, started to complain of episodes of staring off, like time stops, feeling nauseated before, palpitations after -Has worn heart monitor twice, last in July 2021, showed  short runs of SVT & NSVT, occasional PACs, PVCs, SR/SB/ST, metoprolol was increased -EEG abnormal in June 2021, evidence of mild dysrhythmic slow activity involving right hemisphere with frequent sharp transient involving right hemisphere frontal, temporal, parietal region, indicating focal irritability -Zonegran was added in February 2022, since then no further staring spells, tolerates medications well -Follow-up with me in 6 months to reevaluate her weight, call for any seizures  Butler Denmark, AGNP-C, DNP 10/28/2021, 8:39 AM [facility] [address] [place], [place] [address] [phone]

## 2021-10-29 ENCOUNTER — Telehealth: Payer: Self-pay | Admitting: Neurology

## 2021-10-29 LAB — TSH: TSH: 0.78 u[IU]/mL (ref 0.450–4.500)

## 2021-10-29 LAB — COMPREHENSIVE METABOLIC PANEL
ALT: 17 IU/L (ref 0–32)
AST: 20 IU/L (ref 0–40)
Albumin/Globulin Ratio: 1.7 (ref 1.2–2.2)
Albumin: 4.8 g/dL (ref 3.8–4.8)
Alkaline Phosphatase: 81 IU/L (ref 44–121)
BUN/Creatinine Ratio: 9 — ABNORMAL LOW (ref 12–28)
BUN: 9 mg/dL (ref 8–27)
Bilirubin Total: 0.5 mg/dL (ref 0.0–1.2)
CO2: 22 mmol/L (ref 20–29)
Calcium: 10.5 mg/dL — ABNORMAL HIGH (ref 8.7–10.3)
Chloride: 106 mmol/L (ref 96–106)
Creatinine, Ser: 1.04 mg/dL — ABNORMAL HIGH (ref 0.57–1.00)
Globulin, Total: 2.8 g/dL (ref 1.5–4.5)
Glucose: 92 mg/dL (ref 70–99)
Potassium: 3.8 mmol/L (ref 3.5–5.2)
Sodium: 144 mmol/L (ref 134–144)
Total Protein: 7.6 g/dL (ref 6.0–8.5)
eGFR: 56 mL/min/{1.73_m2} — ABNORMAL LOW (ref >59)

## 2021-10-29 LAB — CBC WITH DIFFERENTIAL/PLATELET
Basophils Absolute: 0 10*3/uL (ref 0.0–0.2)
Basos: 0 %
EOS (ABSOLUTE): 0.1 10*3/uL (ref 0.0–0.4)
Eos: 1 %
Hematocrit: 33.5 % — ABNORMAL LOW (ref 34.0–46.6)
Hemoglobin: 10.8 g/dL — ABNORMAL LOW (ref 11.1–15.9)
Immature Grans (Abs): 0 10*3/uL (ref 0.0–0.1)
Immature Granulocytes: 0 %
Lymphocytes Absolute: 2.8 10*3/uL (ref 0.7–3.1)
Lymphs: 47 %
MCH: 28.5 pg (ref 26.6–33.0)
MCHC: 32.2 g/dL (ref 31.5–35.7)
MCV: 88 fL (ref 79–97)
Monocytes Absolute: 0.5 10*3/uL (ref 0.1–0.9)
Monocytes: 8 %
Neutrophils Absolute: 2.6 10*3/uL (ref 1.4–7.0)
Neutrophils: 44 %
Platelets: 265 10*3/uL (ref 150–450)
RBC: 3.79 x10E6/uL (ref 3.77–5.28)
RDW: 14.3 % (ref 11.7–15.4)
WBC: 6 10*3/uL (ref 3.4–10.8)

## 2021-10-29 LAB — ZONISAMIDE LEVEL: Zonisamide: 6.1 ug/mL — ABNORMAL LOW (ref 10.0–40.0)

## 2021-10-29 LAB — LEVETIRACETAM LEVEL: Levetiracetam Lvl: 31.5 ug/mL (ref 10.0–40.0)

## 2021-10-29 NOTE — Telephone Encounter (Signed)
[name]([place]) pharmacy asking if can refill levETIRAcetam (KEPPRA) 1000 MG tablet for a year.  Pt is making the request. Want to make sure we can do this due to the kind of medication it is. Would like a call from the nurse

## 2021-10-29 NOTE — Telephone Encounter (Signed)
I called [name] spoke with [name] and provided VO for 1 year supply of Keppra. He asked if I could provide verbal for metoprolol as well but I advised we do no rx this med and would have to the call prescribing doctors office ( per epic, looks like Dr. Gwenlyn Found is the prescribing MD.

## 2022-05-02 ENCOUNTER — Telehealth: Payer: Self-pay | Admitting: Neurology

## 2022-05-02 NOTE — Progress Notes (Unsigned)
PATIENT: Maria Livingston DOB: 1945-05-08  REASON FOR VISIT: follow up HISTORY FROM: patient PRIMARY NEUROLOGIST: Willis/Camara  HISTORY OF PRESENT ILLNESS: Today 05/03/22 Here today alone, doing well. Weight is stable, is up 4 lbs at 115 lbs. No seizures. Remains on Keppra 1500 mg twice daily, Zonegran 100 mg twice daily. On Remeron 15 mg daily. She didn't want to come today, feels everything is the same, she isn't going to gain anymore weight. She is eating a lot of protein. Stress level is better. She stays up late watching the news, gets about 6 hours of sleep during the day. 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 Labs 10/28/21 TSH 0.780, Keppra level 31.5, Zonegran 6.1, creatinine 1.04.  Update 10/28/21 SS: Maria Livingston is here today for follow-up.  Remains on Keppra 1500 mg twice a day, Zonegran 100 mg twice a day.  No further seizure spells since Zonegran was added in February 2022. Mentions weight loss, has lost about 25 lbs in the last year, is not eating enough, doesn't like to cook, lives alone. Mentions a lot of stress in her life, with passing of several family members in tragic situations. Worry over a personal situation.  Has been on Remeron for several years, has stopped within the last year.  Is seeing PCP today.  No concerns or questions.  Update 04/28/20 SS: Maria Livingston is a 78 year old female with history of seizures, typical are generalized tonic-clonic, and in November 2020 started complaining of episodes of staring off.  Has cardiology consult.  EEG has been abnormal.  Her staring spells are described as:  1.  Feels nauseated 2.  Has a lapse in memory  3.  When she comes back around, her heart is racing; there is no shaking, falling to the ground  Remains on Keppra 1500 mg twice a day, Zonegran was added in February 2022, currently taking 100 mg twice daily.  Since  the addition of Zonegran, has had no further staring spells.  In general, she is feeling better, has more energy, is walking.  She denies any side effects of the medications.  She is compliant.  Continues to live with her sister, she is not driving.  She is pleased with how well she is doing.  Here today for evaluation unaccompanied.  Update 12/09/2019 SS: Maria Livingston is a 77 year old female with history of seizures since 2013. Typical seizures are generalized tonic-clonic, last in May 2018, back in Nov 2020 reported episodes of staring off, Keppra was increased 750 mg twice daily. Saw cardiology last year, wore a heart monitor in July 2020, nonsustained V. tach and palpitations, was started on metoprolol. Had EEG in June 2021, abnormal, evidence of mild dysrhythmic slow activity in the right hemisphere, with frequent sharp transient involving the right hemisphere frontal, temporal, parietal region, indicating focal irritability. Wore heart monitor again in June 2021, short runs of SVT and NSVT, metoprolol increased 25 mg, 1.5 tablets twice a day.  Continues to report about once daily spells, where time will stop, does not know what she is doing, last only a few seconds, then back to baseline.  Lives with her sister, has not noticed ever. She thinks the higher dose metoprolol was helpful, but is not sure. She does seem to stop what she is doing when the spells occur.  Feels nauseated before the spells, feels her heart is racing afterwards.  Remains on Keppra 750 mg twice daily.  Here today for follow-up unaccompanied.  HISTORY 06/10/2019 SS: Maria Livingston is a 77 year old female history of seizures since 2013.  Her typical seizures are generalized tonic-clonic (last in May 2018), when last seen, reported episodes of staring off.  Keppra was increased 750 mg twice a day.  Continues to report these episodes a few times a week, did not notice any change with higher dose of Keppra.  Describes them as staring off/zoning  out lasting for a few seconds only, able to continue function, afterwards feels her heart racing/palpitations. She says no one would notice them, she knows because sense of loss of time. No loss of vision.  Had cardiology evaluation last year, wore a heart monitor in July, nonsustained V. tach and palpitations, she was started on metoprolol.  She is not clear if she was having the staring episodes at that time.  Is compliant medications. Presents today for evaluation unaccompanied.  REVIEW OF SYSTEMS: Out of a complete 14 system review of symptoms, the patient complains only of the following symptoms, and all other reviewed systems are negative.  See HPI  ALLERGIES: No Known Allergies  HOME MEDICATIONS: Outpatient Medications Prior to Visit  Medication Sig Dispense Refill   aspirin EC 81 MG tablet Take 81 mg by mouth daily.     hydrochlorothiazide (HYDRODIURIL) 12.5 MG tablet Take 12.5 mg by mouth daily.     levETIRAcetam (KEPPRA) 1000 MG tablet Take 1.5 tablets twice daily 270 tablet 3   levothyroxine (SYNTHROID) 75 MCG tablet Take 37.5 mcg by mouth daily before breakfast.     losartan (COZAAR) 50 MG tablet Take 50 mg by mouth daily.     metoprolol tartrate (LOPRESSOR) 25 MG tablet Take 1&1/2 tablets twice a day. 180 tablet 3   mirtazapine (REMERON) 15 MG tablet Take 15 mg by mouth at bedtime.     VITAMIN D, CHOLECALCIFEROL, PO Take 50 mg by mouth daily.     zonisamide (ZONEGRAN) 100 MG capsule Take 1 capsule (100 mg total) by mouth 2 (two) times daily. 180 capsule 3   potassium chloride (KLOR-CON) 10 MEQ tablet Take 1 tablet (10 mEq total) by mouth daily. 30 tablet 2   No facility-administered medications prior to visit.    PAST MEDICAL HISTORY: Past Medical History:  Diagnosis Date   Estrogen deficiency    Hypertension    Hypothyroid    Seizure disorder 11/29/2017   Seizures    Vision abnormalities     PAST SURGICAL HISTORY: Past Surgical History:  Procedure Laterality Date    ABDOMINAL HYSTERECTOMY     BREAST CYST EXCISION Bilateral 1970   CYSTECTOMY     bilateral breast   TEE WITHOUT CARDIOVERSION N/A 08/16/2018   Procedure: TRANSESOPHAGEAL ECHOCARDIOGRAM (TEE);  Surgeon: Jodelle Red, MD;  Location: [facility] ENDOSCOPY;  Service: Cardiovascular;  Laterality: N/A;   THYROID SURGERY     TOE SURGERY     TONSILLECTOMY      FAMILY HISTORY: Family History  Problem Relation Age of Onset   Osteoarthritis Mother    Hypertension Mother    Hypertension Father    Stroke Father    Hypertension Brother    Prostate cancer Brother     SOCIAL HISTORY: Social History   Socioeconomic History   Marital status: Single    Spouse name: Not on file   Number of children: Not on file   Years of education: Not on file   Highest education level: Not on file  Occupational History   Not on file  Tobacco Use   Smoking status: Former    Packs/day: 1    Types: Cigarettes    Quit date: 07/02/2004    Years since quitting: 17.8   Smokeless tobacco: Never  Substance and Sexual Activity   Alcohol use: Yes    Alcohol/week: 0.0 standard drinks of alcohol    Comment: 1 glass of wine per day   Drug use: No   Sexual activity: Not Currently  Other Topics Concern   Not on file  Social History Narrative   Not on file   Social Determinants of Health   Financial Resource Strain: Not on file  Food Insecurity: Not on file  Transportation Needs: Not on file  Physical Activity: Not on file  Stress: Not on file  Social Connections: Not on file  Intimate Partner Violence: Not on file   PHYSICAL EXAM  Vitals:   05/03/22 0841 05/03/22 0842  BP: (!) 151/61 (!) 145/67  Pulse: 68 63  Weight: 115 lb (52.2 kg)   Height:  (1.626 m)     Body mass index is 19.74 kg/m.  Generalized: Well developed, in no acute distress   Neurological examination  Mentation: Alert oriented to time, place, history taking. Follows all commands speech and language fluent Cranial nerve  II-XII: Pupils were equal round reactive to light. Extraocular movements were full, visual field were full on confrontational test. Facial sensation and strength were normal. Head turning and shoulder shrug  were normal and symmetric. Motor: The motor testing reveals 5 over 5 strength of all 4 extremities. Good symmetric motor tone is noted throughout.  Sensory: Sensory testing is intact to soft touch on all 4 extremities. No evidence of extinction is noted.  Coordination: Cerebellar testing reveals good finger-nose-finger and heel-to-shin bilaterally.  Gait and station: Gait is normal.  Tandem gait is normal. Reflexes: Deep tendon reflexes are symmetric and normal bilaterally.   DIAGNOSTIC DATA (LABS, IMAGING, TESTING) - I reviewed patient records, labs, notes, testing and imaging myself where available.  Lab Results  Component Value Date   WBC 6.0 10/28/2021   HGB 10.8 (L) 10/28/2021   HCT 33.5 (L) 10/28/2021   MCV 88 10/28/2021   PLT 265 10/28/2021      Component Value Date/Time   NA 144 10/28/2021 0910   K 3.8 10/28/2021 0910   CL 106 10/28/2021 0910   CO2 22 10/28/2021 0910   GLUCOSE 92 10/28/2021 0910   GLUCOSE 90 07/25/2018 0841   BUN 9 10/28/2021 0910   CREATININE 1.04 (H) 10/28/2021 0910   CALCIUM 10.5 (H) 10/28/2021 0910   PROT 7.6 10/28/2021 0910   ALBUMIN 4.8 10/28/2021 0910   AST 20 10/28/2021 0910   ALT 17 10/28/2021 0910   ALKPHOS 81 10/28/2021 0910   BILITOT 0.5 10/28/2021 0910   GFRNONAA 68 08/21/2018 1030   GFRAA 79 08/21/2018 1030   No results found for: "CHOL", "HDL", "LDLCALC", "LDLDIRECT", "TRIG", "CHOLHDL" No results found for: "HGBA1C" No results found for: "VITAMINB12" Lab Results  Component Value Date   TSH 0.780 10/28/2021    ASSESSMENT AND PLAN 77 y.o. year old female  has a past medical history of Estrogen deficiency, Hypertension, Hypothyroid, Seizure disorder (11/29/2017), Seizures, and Vision abnormalities. here with:  1.  Seizure  disorder 2.  Weight loss  -No seizures reported -We will continue current seizure medications -Needs to monitor weight, right now is stable, will continue to follow to see Zonegran contribution in weight  loss, right now weight is up 4 lbs, is on Remeron, continue close follow up with primary care  -She is very happy with her current seizure medication regimen, doesn't want to make any changes  -Will check labs next visit   -Last tonic-clonic seizure was in May 2018 -In November 2020, started to complain of episodes of staring off, like time stops, feeling nauseated before, palpitations after -Has worn heart monitor twice, last in July 2021, showed short runs of SVT & NSVT, occasional PACs, PVCs, SR/SB/ST, metoprolol was increased -EEG abnormal in June 2021, evidence of mild dysrhythmic slow activity involving right hemisphere with frequent sharp transient involving right hemisphere frontal, temporal, parietal region, indicating focal irritability -Zonegran was added in February 2022, since then no further staring spells, tolerates medications well -We agreed to see each other in 6 months for revisit, monitor weight  Meds ordered this encounter  Medications   zonisamide (ZONEGRAN) 100 MG capsule    Sig: Take 1 capsule (100 mg total) by mouth 2 (two) times daily.    Dispense:  180 capsule    Refill:  3   levETIRAcetam (KEPPRA) 1000 MG tablet    Sig: Take 1.5 tablets twice daily    Dispense:  270 tablet    Refill:  3   Maria Livingston, AGNP-C, DNP 05/03/2022, 8:50 AM [facility] [address] [place], [place] [address] [phone]

## 2022-05-02 NOTE — Telephone Encounter (Signed)
Called pt and informed her that she should come to her appt as labs may be needed and pt stated she already had labs done the last time she was here. She states that she has not had a seizure since 2019 and does not see the point of following up. She stated that she is needing a refill on her levETIRAcetam (KEPPRA) 1000 MG tablet if she needs to come to the appt for her medication to be filled then she will come in tomorrow. Please advise.

## 2022-05-02 NOTE — Telephone Encounter (Signed)
Pt wants to know if appointment is needed on 4/16 with Sarah. Pt stated she gain a pound or two and haven't had any seizures.

## 2022-05-03 ENCOUNTER — Encounter: Payer: Self-pay | Admitting: Neurology

## 2022-05-03 ENCOUNTER — Ambulatory Visit: Payer: Medicare Other | Admitting: Neurology

## 2022-05-03 VITALS — BP 145/67 | HR 63 | Ht 64.0 in | Wt 115.0 lb

## 2022-05-03 DIAGNOSIS — G40909 Epilepsy, unspecified, not intractable, without status epilepticus: Secondary | ICD-10-CM | POA: Diagnosis not present

## 2022-05-03 MED ORDER — LEVETIRACETAM 1000 MG PO TABS: ORAL_TABLET | ORAL | 3 refills | Status: DC

## 2022-05-03 MED ORDER — ZONISAMIDE 100 MG PO CAPS: 100.0000 mg | ORAL_CAPSULE | Freq: Two times a day (BID) | ORAL | 3 refills | Status: DC

## 2022-05-03 NOTE — Patient Instructions (Signed)
Continue current medications  Monitor weight, eat more, I don't want to lose anymore weight  Call for seizures See you back in 6 months

## 2022-05-19 DIAGNOSIS — R809 Proteinuria, unspecified: Secondary | ICD-10-CM | POA: Diagnosis not present

## 2022-05-19 DIAGNOSIS — Z0000 Encounter for general adult medical examination without abnormal findings: Secondary | ICD-10-CM | POA: Diagnosis not present

## 2022-05-19 DIAGNOSIS — D649 Anemia, unspecified: Secondary | ICD-10-CM | POA: Diagnosis not present

## 2022-05-19 DIAGNOSIS — I10 Essential (primary) hypertension: Secondary | ICD-10-CM | POA: Diagnosis not present

## 2022-05-19 DIAGNOSIS — E46 Unspecified protein-calorie malnutrition: Secondary | ICD-10-CM | POA: Diagnosis not present

## 2022-05-19 DIAGNOSIS — N182 Chronic kidney disease, stage 2 (mild): Secondary | ICD-10-CM | POA: Diagnosis not present

## 2022-05-19 DIAGNOSIS — E041 Nontoxic single thyroid nodule: Secondary | ICD-10-CM | POA: Diagnosis not present

## 2022-05-19 DIAGNOSIS — E039 Hypothyroidism, unspecified: Secondary | ICD-10-CM | POA: Diagnosis not present

## 2022-08-03 DIAGNOSIS — E039 Hypothyroidism, unspecified: Secondary | ICD-10-CM | POA: Diagnosis not present

## 2022-09-26 DIAGNOSIS — R051 Acute cough: Secondary | ICD-10-CM | POA: Diagnosis not present

## 2022-09-26 DIAGNOSIS — R0981 Nasal congestion: Secondary | ICD-10-CM | POA: Diagnosis not present

## 2022-09-26 DIAGNOSIS — R197 Diarrhea, unspecified: Secondary | ICD-10-CM | POA: Diagnosis not present

## 2022-09-26 DIAGNOSIS — J069 Acute upper respiratory infection, unspecified: Secondary | ICD-10-CM | POA: Diagnosis not present

## 2022-09-26 DIAGNOSIS — E860 Dehydration: Secondary | ICD-10-CM | POA: Diagnosis not present

## 2022-09-27 DIAGNOSIS — R509 Fever, unspecified: Secondary | ICD-10-CM | POA: Diagnosis not present

## 2022-09-27 DIAGNOSIS — J069 Acute upper respiratory infection, unspecified: Secondary | ICD-10-CM | POA: Diagnosis not present

## 2022-09-27 DIAGNOSIS — R051 Acute cough: Secondary | ICD-10-CM | POA: Diagnosis not present

## 2022-09-27 DIAGNOSIS — Z03818 Encounter for observation for suspected exposure to other biological agents ruled out: Secondary | ICD-10-CM | POA: Diagnosis not present

## 2022-11-07 NOTE — Progress Notes (Unsigned)
PATIENT: Maria Livingston DOB: 1945-08-31  REASON FOR VISIT: follow up HISTORY FROM: patient PRIMARY NEUROLOGIST: Willis/Camara  HISTORY OF PRESENT ILLNESS: Today 11/07/22   05/03/22 SS: Here today alone, doing well. Weight is stable, is up 4 lbs at 115 lbs. No seizures. Remains on Keppra 1500 mg twice daily, Zonegran 100 mg twice daily. On Remeron 15 mg daily. She didn't want to come today, feels everything is the same, she isn't going to gain anymore weight. She is eating a lot of protein. Stress level is better. She stays up late watching the news, gets about 6 hours of sleep during the day. 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 Labs 10/28/21 TSH 0.780, Keppra level 31.5, Zonegran 6.1, creatinine 1.04.  Update 10/28/21 SS: Maria Livingston is here today for follow-up.  Remains on Keppra 1500 mg twice a day, Zonegran 100 mg twice a day.  No further seizure spells since Zonegran was added in February 2022. Mentions weight loss, has lost about 25 lbs in the last year, is not eating enough, doesn't like to cook, lives alone. Mentions a lot of stress in her life, with passing of several family members in tragic situations. Worry over a personal situation.  Has been on Remeron for several years, has stopped within the last year.  Is seeing PCP today.  No concerns or questions.  Update 04/28/20 SS: Maria Livingston is a 77 year old female with history of seizures, typical are generalized tonic-clonic, and in November 2020 started complaining of episodes of staring off.  Has cardiology consult.  EEG has been abnormal.  Her staring spells are described as:  1.  Feels nauseated 2.  Has a lapse in memory  3.  When she comes back around, her heart is racing; there is no shaking, falling to the ground  Remains on Keppra 1500 mg twice a day, Zonegran was added in February 2022, currently taking 100 mg twice  daily.  Since the addition of Zonegran, has had no further staring spells.  In general, she is feeling better, has more energy, is walking.  She denies any side effects of the medications.  She is compliant.  Continues to live with her sister, she is not driving.  She is pleased with how well she is doing.  Here today for evaluation unaccompanied.  Update 12/09/2019 SS: Maria Livingston is a 77 year old female with history of seizures since 2013. Typical seizures are generalized tonic-clonic, last in May 2018, back in Nov 2020 reported episodes of staring off, Keppra was increased 750 mg twice daily. Saw cardiology last year, wore a heart monitor in July 2020, nonsustained V. tach and palpitations, was started on metoprolol. Had EEG in June 2021, abnormal, evidence of mild dysrhythmic slow activity in the right hemisphere, with frequent sharp transient involving the right hemisphere frontal, temporal, parietal region, indicating focal irritability. Wore heart monitor again in June 2021, short runs of SVT and NSVT, metoprolol increased 25 mg, 1.5 tablets twice a day.  Continues to report about once daily spells, where time will stop, does not know what she is doing, last only a few seconds, then back to baseline.  Lives with her sister, has not noticed ever. She thinks the higher dose metoprolol was helpful, but is not sure. She does seem to stop what she is doing when the spells occur.  Feels nauseated before the spells, feels her  heart is racing afterwards.  Remains on Keppra 750 mg twice daily.  Here today for follow-up unaccompanied.  HISTORY 06/10/2019 SS: Maria Livingston is a 77 year old female history of seizures since 2013.  Her typical seizures are generalized tonic-clonic (last in May 2018), when last seen, reported episodes of staring off.  Keppra was increased 750 mg twice a day.  Continues to report these episodes a few times a week, did not notice any change with higher dose of Keppra.  Describes them as  staring off/zoning out lasting for a few seconds only, able to continue function, afterwards feels her heart racing/palpitations. She says no one would notice them, she knows because sense of loss of time. No loss of vision.  Had cardiology evaluation last year, wore a heart monitor in July, nonsustained V. tach and palpitations, she was started on metoprolol.  She is not clear if she was having the staring episodes at that time.  Is compliant medications. Presents today for evaluation unaccompanied.  REVIEW OF SYSTEMS: Out of a complete 14 system review of symptoms, the patient complains only of the following symptoms, and all other reviewed systems are negative.  See HPI  ALLERGIES: No Known Allergies  HOME MEDICATIONS: Outpatient Medications Prior to Visit  Medication Sig Dispense Refill   aspirin EC 81 MG tablet Take 81 mg by mouth daily.     hydrochlorothiazide (HYDRODIURIL) 12.5 MG tablet Take 12.5 mg by mouth daily.     levETIRAcetam (KEPPRA) 1000 MG tablet Take 1.5 tablets twice daily 270 tablet 3   levothyroxine (SYNTHROID) 75 MCG tablet Take 37.5 mcg by mouth daily before breakfast.     losartan (COZAAR) 50 MG tablet Take 50 mg by mouth daily.     metoprolol tartrate (LOPRESSOR) 25 MG tablet Take 1&1/2 tablets twice a day. 180 tablet 3   mirtazapine (REMERON) 15 MG tablet Take 15 mg by mouth at bedtime.     potassium chloride (KLOR-CON) 10 MEQ tablet Take 1 tablet (10 mEq total) by mouth daily. 30 tablet 2   VITAMIN D, CHOLECALCIFEROL, PO Take 50 mg by mouth daily.     zonisamide (ZONEGRAN) 100 MG capsule Take 1 capsule (100 mg total) by mouth 2 (two) times daily. 180 capsule 3   No facility-administered medications prior to visit.    PAST MEDICAL HISTORY: Past Medical History:  Diagnosis Date   Estrogen deficiency    Hypertension    Hypothyroid    Seizure disorder (HCC) 11/29/2017   Seizures (HCC)    Vision abnormalities     PAST SURGICAL HISTORY: Past Surgical  History:  Procedure Laterality Date   ABDOMINAL HYSTERECTOMY     BREAST CYST EXCISION Bilateral 1970   CYSTECTOMY     bilateral breast   TEE WITHOUT CARDIOVERSION N/A 08/16/2018   Procedure: TRANSESOPHAGEAL ECHOCARDIOGRAM (TEE);  Surgeon: Jodelle Red, MD;  Location: [facility] ENDOSCOPY;  Service: Cardiovascular;  Laterality: N/A;   THYROID SURGERY     TOE SURGERY     TONSILLECTOMY      FAMILY HISTORY: Family History  Problem Relation Age of Onset   Osteoarthritis Mother    Hypertension Mother    Hypertension Father    Stroke Father    Hypertension Brother    Prostate cancer Brother     SOCIAL HISTORY: Social History   Socioeconomic History   Marital status: Single    Spouse name: Not on file   Number of children: Not on file   Years of education: Not on file  Highest education level: Not on file  Occupational History   Not on file  Tobacco Use   Smoking status: Former    Current packs/day: 0.00    Types: Cigarettes    Quit date: 07/02/2004    Years since quitting: 18.3   Smokeless tobacco: Never  Substance and Sexual Activity   Alcohol use: Yes    Alcohol/week: 0.0 standard drinks of alcohol    Comment: 1 glass of wine per day   Drug use: No   Sexual activity: Not Currently  Other Topics Concern   Not on file  Social History Narrative   Not on file   Social Determinants of Health   Financial Resource Strain: Not on file  Food Insecurity: Not on file  Transportation Needs: Not on file  Physical Activity: Not on file  Stress: Not on file  Social Connections: Not on file  Intimate Partner Violence: Not on file   PHYSICAL EXAM  There were no vitals filed for this visit.   There is no height or weight on file to calculate BMI.  Generalized: Well developed, in no acute distress   Neurological examination  Mentation: Alert oriented to time, place, history taking. Follows all commands speech and language fluent Cranial nerve II-XII: Pupils were  equal round reactive to light. Extraocular movements were full, visual field were full on confrontational test. Facial sensation and strength were normal. Head turning and shoulder shrug  were normal and symmetric. Motor: The motor testing reveals 5 over 5 strength of all 4 extremities. Good symmetric motor tone is noted throughout.  Sensory: Sensory testing is intact to soft touch on all 4 extremities. No evidence of extinction is noted.  Coordination: Cerebellar testing reveals good finger-nose-finger and heel-to-shin bilaterally.  Gait and station: Gait is normal.  Tandem gait is normal. Reflexes: Deep tendon reflexes are symmetric and normal bilaterally.   DIAGNOSTIC DATA (LABS, IMAGING, TESTING) - I reviewed patient records, labs, notes, testing and imaging myself where available.  Lab Results  Component Value Date   WBC 6.0 10/28/2021   HGB 10.8 (L) 10/28/2021   HCT 33.5 (L) 10/28/2021   MCV 88 10/28/2021   PLT 265 10/28/2021      Component Value Date/Time   NA 144 10/28/2021 0910   K 3.8 10/28/2021 0910   CL 106 10/28/2021 0910   CO2 22 10/28/2021 0910   GLUCOSE 92 10/28/2021 0910   GLUCOSE 90 07/25/2018 0841   BUN 9 10/28/2021 0910   CREATININE 1.04 (H) 10/28/2021 0910   CALCIUM 10.5 (H) 10/28/2021 0910   PROT 7.6 10/28/2021 0910   ALBUMIN 4.8 10/28/2021 0910   AST 20 10/28/2021 0910   ALT 17 10/28/2021 0910   ALKPHOS 81 10/28/2021 0910   BILITOT 0.5 10/28/2021 0910   GFRNONAA 68 08/21/2018 1030   GFRAA 79 08/21/2018 1030   No results found for: "CHOL", "HDL", "LDLCALC", "LDLDIRECT", "TRIG", "CHOLHDL" No results found for: "HGBA1C" No results found for: "VITAMINB12" Lab Results  Component Value Date   TSH 0.780 10/28/2021    ASSESSMENT AND PLAN 77 y.o. year old female  has a past medical history of Estrogen deficiency, Hypertension, Hypothyroid, Seizure disorder (HCC) (11/29/2017), Seizures (HCC), and Vision abnormalities. here with:  1.  Seizure disorder 2.   Weight loss  -No seizures reported -We will continue current seizure medications -Needs to monitor weight, right now is stable, will continue to follow to see Zonegran contribution in weight loss, right now weight is up 4 lbs, is  on Remeron, continue close follow up with primary care  -She is very happy with her current seizure medication regimen, doesn't want to make any changes  -Will check labs next visit   -Last tonic-clonic seizure was in May 2018 -In November 2020, started to complain of episodes of staring off, like time stops, feeling nauseated before, palpitations after -Has worn heart monitor twice, last in July 2021, showed short runs of SVT & NSVT, occasional PACs, PVCs, SR/SB/ST, metoprolol was increased -EEG abnormal in June 2021, evidence of mild dysrhythmic slow activity involving right hemisphere with frequent sharp transient involving right hemisphere frontal, temporal, parietal region, indicating focal irritability -Zonegran was added in February 2022, since then no further staring spells, tolerates medications well -We agreed to see each other in 6 months for revisit, monitor weight  No orders of the defined types were placed in this encounter.  Margie Ege, AGNP-C, DNP 11/07/2022, 9:31 PM [facility] [address], [address] [place], [place] [address] [phone]

## 2022-11-08 ENCOUNTER — Ambulatory Visit: Payer: Medicare Other | Admitting: Neurology

## 2022-11-08 ENCOUNTER — Encounter: Payer: Self-pay | Admitting: Neurology

## 2022-11-08 VITALS — BP 127/66 | HR 63 | Ht 64.0 in | Wt 106.2 lb

## 2022-11-08 DIAGNOSIS — G40909 Epilepsy, unspecified, not intractable, without status epilepticus: Secondary | ICD-10-CM

## 2022-11-08 MED ORDER — LEVETIRACETAM 1000 MG PO TABS: ORAL_TABLET | ORAL | 3 refills | Status: DC

## 2022-11-08 MED ORDER — ZONISAMIDE 100 MG PO CAPS: 100.0000 mg | ORAL_CAPSULE | Freq: Two times a day (BID) | ORAL | 3 refills | Status: DC

## 2022-11-08 NOTE — Patient Instructions (Signed)
We will continue current Zonegran and Keppra for seizure prevention.  Please closely monitor your weight.  Trying to eat more often, larger meals.  Consider Ensure/boost.  Discussed restarting Remeron with your primary care doctor.  Please let me know if you have any seizure events.  I will try to get your labs from your primary care doctor.  I will see you in 6 months.  Thanks!!

## 2022-11-18 DIAGNOSIS — D649 Anemia, unspecified: Secondary | ICD-10-CM | POA: Diagnosis not present

## 2022-11-18 DIAGNOSIS — N182 Chronic kidney disease, stage 2 (mild): Secondary | ICD-10-CM | POA: Diagnosis not present

## 2022-11-18 DIAGNOSIS — R809 Proteinuria, unspecified: Secondary | ICD-10-CM | POA: Diagnosis not present

## 2022-11-18 DIAGNOSIS — E46 Unspecified protein-calorie malnutrition: Secondary | ICD-10-CM | POA: Diagnosis not present

## 2022-11-18 DIAGNOSIS — I10 Essential (primary) hypertension: Secondary | ICD-10-CM | POA: Diagnosis not present

## 2022-11-18 DIAGNOSIS — E041 Nontoxic single thyroid nodule: Secondary | ICD-10-CM | POA: Diagnosis not present

## 2022-11-18 DIAGNOSIS — E039 Hypothyroidism, unspecified: Secondary | ICD-10-CM | POA: Diagnosis not present

## 2022-11-29 DIAGNOSIS — H9201 Otalgia, right ear: Secondary | ICD-10-CM | POA: Diagnosis not present

## 2022-12-05 DIAGNOSIS — L309 Dermatitis, unspecified: Secondary | ICD-10-CM | POA: Diagnosis not present

## 2022-12-05 DIAGNOSIS — H9201 Otalgia, right ear: Secondary | ICD-10-CM | POA: Diagnosis not present

## 2022-12-05 DIAGNOSIS — Z681 Body mass index (BMI) 19 or less, adult: Secondary | ICD-10-CM | POA: Diagnosis not present

## 2022-12-05 DIAGNOSIS — R569 Unspecified convulsions: Secondary | ICD-10-CM | POA: Diagnosis not present

## 2023-05-31 ENCOUNTER — Encounter: Payer: Self-pay | Admitting: Neurology

## 2023-05-31 ENCOUNTER — Ambulatory Visit: Payer: Medicare Other | Admitting: Neurology

## 2023-05-31 VITALS — BP 142/64 | HR 68 | Resp 15 | Ht 64.0 in | Wt 111.0 lb

## 2023-05-31 DIAGNOSIS — G40909 Epilepsy, unspecified, not intractable, without status epilepticus: Secondary | ICD-10-CM | POA: Diagnosis not present

## 2023-05-31 MED ORDER — LEVETIRACETAM 1000 MG PO TABS: ORAL_TABLET | ORAL | 3 refills | Status: AC

## 2023-05-31 MED ORDER — ZONISAMIDE 100 MG PO CAPS: 100.0000 mg | ORAL_CAPSULE | Freq: Two times a day (BID) | ORAL | 3 refills | Status: AC

## 2023-05-31 NOTE — Patient Instructions (Addendum)
 Please have your primary care doctor check: CBC with diff, CMP, Keppra  level, Zonisamide  level, Vitamin D . Have them send labs to our office and I will review.   Please take medication as prescribed, call for any seizures.

## 2023-05-31 NOTE — Progress Notes (Addendum)
 Reviewed  PATIENT: Maria Livingston DOB: 06-28-1945  REASON FOR VISIT: follow up HISTORY FROM: patient PRIMARY NEUROLOGIST: Willis/Camara  ASSESSMENT AND PLAN 78 y.o. year old female  has a past medical history of Estrogen deficiency, Hypertension, Hypothyroid, Seizure disorder (HCC) (11/29/2017), Seizures (HCC), and Vision abnormalities. here with:  1.  Seizure disorder 2.  Weight loss  -No seizures reported since 2022, we started Zonegran  in 2022 for staring spells that resolved  -I am not exactly clear how she is taking her medications.  She is supposed to take Zonegran  100 mg twice daily, Keppra  1000 mg, 1.5 tablets twice daily. She says she is only taking both once daily. I am not sure that she has ever taken twice daily? I advised her to start Keppra  twice daily, I am okay with her staying on Zonegran  100 mg once daily, due to concern for weight loss, especially since she denies any seizure events. She wants to have her labs done at PCP this week for her physical. I asked PCP to collect CBC, CMP, Keppra , Zonegran  level, Vitamin D  for me to review. I will evaluate levels of Keppra  and Zonegran  once resulted.   -Last tonic-clonic seizure was in May 2018 -In November 2020, started to complain of episodes of staring off, like time stops, feeling nauseated before, palpitations after -Has worn heart monitor twice, last in July 2021, showed short runs of SVT & NSVT, occasional PACs, PVCs, SR/SB/ST, metoprolol  was increased -EEG abnormal in June 2021, evidence of mild dysrhythmic slow activity involving right hemisphere with frequent sharp transient involving right hemisphere frontal, temporal, parietal region, indicating focal irritability -Zonegran  was added in February 2022, since then no further staring spells, tolerates medications well -Call for any seizure activity, I would like for her to see Dr. Samara Crest in 6 months to establish care  Addendum 06/06/23 SS: I am going to order EEG to ensure  no subclinical seizure activity as we are getting adjusted on medication. If she cannot tolerate Keppra  1500 mg BID, we can go back to 1000 mg BID.  Labs from PCP 08/03/22 TSH 0.52, in May 2024 CBC CMP normal.  Addendum 06/15/23 SS: Zonegran  5.3, Keppra  22.3.   Addendum 06/21/23 SS: I called the patient. Advised to make sure takes keppra  1500 mg BID, is only taking Zonegran  100 mg once a day, she can continue at this dose since no seizure, as well as we are monitoring her weight, a lower dose may better to prevent further weight loss. She is to call for any seizures.   Meds ordered this encounter  Medications   levETIRAcetam  (KEPPRA ) 1000 MG tablet    Sig: Take 1.5 tablets twice daily    Dispense:  270 tablet    Refill:  3    Refills on file   zonisamide  (ZONEGRAN ) 100 MG capsule    Sig: Take 1 capsule (100 mg total) by mouth 2 (two) times daily.    Dispense:  180 capsule    Refill:  3    Refills on file   HISTORY OF PRESENT ILLNESS: Today 05/31/23 Weight us  up 5 lbs (111 lbs). No seizures. Reports only taking Keppra  and Zonegran  once daily. Claims eating well, very healthy. Living alone, driving. Health has been good. Reports her son checks in on her daily. Seeing primary care this week for physical and labs, would like to have labs done there.  Is never taking Remeron again. Stays up until 2 AM watching a TV show. Denies any staring spells.  11/08/22 SS: Has lost 10 lbs since  seen in April, remains on Zonegran , Keppra . No seizures, was supposed to go to [place] in a month, but put her trip on hold due to worry about crime. Has dentures, has to take her teeth out to eat, eats healthy, lean foods. I mention her weight, claims her PCP has also mentioned, and her son. Thinks her weight is okay for her age. Since starting Zonegran  in 2022 has lost 20 lbs. Lives alone, drives a car. Claims her son thinks she is doing overall well. Has a good appetite, wakes up in the morning thinking about eating.  Remeron is on her list, but doesn't take it, claims it makes her sleepy.  She does not like to discuss the issue of her weight.   05/03/22 SS: Here today alone, doing well. Weight is stable, is up 4 lbs at 115 lbs. No seizures. Remains on Keppra  1500 mg twice daily, Zonegran  100 mg twice daily. On Remeron 15 mg daily. She didn't want to come today, feels everything is the same, she isn't going to gain anymore weight. She is eating a lot of protein. Stress level is better. She stays up late watching the news, gets about 6 hours of sleep during the day. 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 Labs 10/28/21 TSH 0.780, Keppra  level 31.5, Zonegran  6.1, creatinine 1.04.  Update 10/28/21 SS: Ms. Arne Langdon is here today for follow-up.  Remains on Keppra  1500 mg twice a day, Zonegran  100 mg twice a day.  No further seizure spells since Zonegran  was added in February 2022. Mentions weight loss, has lost about 25 lbs in the last year, is not eating enough, doesn't like to cook, lives alone. Mentions a lot of stress in her life, with passing of several family members in tragic situations. Worry over a personal situation.  Has been on Remeron for several years, has stopped within the last year.  Is seeing PCP today.  No concerns or questions.  Update 04/28/20 SS: Ms. Romaniello is a 78 year old female with history of seizures, typical are generalized tonic-clonic, and in November 2020 started complaining of episodes of staring off.  Has cardiology consult.  EEG has been abnormal.  Her staring spells are described as:  1.  Feels nauseated 2.  Has a lapse in memory  3.  When she comes back around, her heart is racing; there is no shaking, falling to the ground  Remains on Keppra  1500 mg twice a day, Zonegran  was added in February 2022, currently taking 100 mg twice daily.  Since the addition of Zonegran , has had no  further staring spells.  In general, she is feeling better, has more energy, is walking.  She denies any side effects of the medications.  She is compliant.  Continues to live with her sister, she is not driving.  She is pleased with how well she is doing.  Here today for evaluation unaccompanied.  Update 12/09/2019 SS: Ms. Kilner is a 78 year old female with history of seizures since 2013. Typical seizures are generalized tonic-clonic, last in May 2018, back in Nov 2020 reported episodes of staring off, Keppra  was increased 750 mg twice daily. Saw cardiology last year, wore a heart monitor in July 2020, nonsustained V. tach and palpitations, was started on metoprolol . Had EEG in June 2021, abnormal, evidence of mild dysrhythmic slow activity in the  right hemisphere, with frequent sharp transient involving the right hemisphere frontal, temporal, parietal region, indicating focal irritability. Wore heart monitor again in June 2021, short runs of SVT and NSVT, metoprolol  increased 25 mg, 1.5 tablets twice a day.  Continues to report about once daily spells, where time will stop, does not know what she is doing, last only a few seconds, then back to baseline.  Lives with her sister, has not noticed ever. She thinks the higher dose metoprolol  was helpful, but is not sure. She does seem to stop what she is doing when the spells occur.  Feels nauseated before the spells, feels her heart is racing afterwards.  Remains on Keppra  750 mg twice daily.  Here today for follow-up unaccompanied.  HISTORY 06/10/2019 SS: Ms. Heaphy is a 78 year old female history of seizures since 2013.  Her typical seizures are generalized tonic-clonic (last in May 2018), when last seen, reported episodes of staring off.  Keppra  was increased 750 mg twice a day.  Continues to report these episodes a few times a week, did not notice any change with higher dose of Keppra .  Describes them as staring off/zoning out lasting for a few seconds only,  able to continue function, afterwards feels her heart racing/palpitations. She says no one would notice them, she knows because sense of loss of time. No loss of vision.  Had cardiology evaluation last year, wore a heart monitor in July, nonsustained V. tach and palpitations, she was started on metoprolol .  She is not clear if she was having the staring episodes at that time.  Is compliant medications. Presents today for evaluation unaccompanied.  REVIEW OF SYSTEMS: Out of a complete 14 system review of symptoms, the patient complains only of the following symptoms, and all other reviewed systems are negative.  See HPI  ALLERGIES: No Known Allergies  HOME MEDICATIONS: Outpatient Medications Prior to Visit  Medication Sig Dispense Refill   aspirin EC 81 MG tablet Take 81 mg by mouth daily.     hydrochlorothiazide (HYDRODIURIL) 12.5 MG tablet Take 12.5 mg by mouth daily.     levothyroxine (SYNTHROID) 75 MCG tablet Take 37.5 mcg by mouth daily before breakfast.     losartan (COZAAR) 50 MG tablet Take 50 mg by mouth daily.     metoprolol  tartrate (LOPRESSOR ) 25 MG tablet Take 1&1/2 tablets twice a day. 180 tablet 3   mirtazapine (REMERON) 15 MG tablet Take 15 mg by mouth at bedtime.     potassium chloride  (KLOR-CON ) 10 MEQ tablet Take 1 tablet (10 mEq total) by mouth daily. 30 tablet 2   VITAMIN D , CHOLECALCIFEROL, PO Take 50 mg by mouth daily.     levETIRAcetam  (KEPPRA ) 1000 MG tablet Take 1.5 tablets twice daily 270 tablet 3   zonisamide  (ZONEGRAN ) 100 MG capsule Take 1 capsule (100 mg total) by mouth 2 (two) times daily. 180 capsule 3   No facility-administered medications prior to visit.    PAST MEDICAL HISTORY: Past Medical History:  Diagnosis Date   Estrogen deficiency    Hypertension    Hypothyroid    Seizure disorder (HCC) 11/29/2017   Seizures (HCC)    Vision abnormalities     PAST SURGICAL HISTORY: Past Surgical History:  Procedure Laterality Date   ABDOMINAL  HYSTERECTOMY     BREAST CYST EXCISION Bilateral 1970   CYSTECTOMY     bilateral breast   TEE WITHOUT CARDIOVERSION N/A 08/16/2018   Procedure: TRANSESOPHAGEAL ECHOCARDIOGRAM (TEE);  Surgeon: Sheryle Donning, MD;  Location[facility]  ENDOSCOPY;  Service: Cardiovascular;  Laterality: N/A;   THYROID  SURGERY     TOE SURGERY     TONSILLECTOMY      FAMILY HISTORY: Family History  Problem Relation Age of Onset   Osteoarthritis Mother    Hypertension Mother    Hypertension Father    Stroke Father    Hypertension Brother    Prostate cancer Brother     SOCIAL HISTORY: Social History   Socioeconomic History   Marital status: Single    Spouse name: Not on file   Number of children: Not on file   Years of education: Not on file   Highest education level: Not on file  Occupational History   Not on file  Tobacco Use   Smoking status: Former    Current packs/day: 0.00    Types: Cigarettes    Quit date: 07/02/2004    Years since quitting: 18.9   Smokeless tobacco: Never  Substance and Sexual Activity   Alcohol use: Yes    Alcohol/week: 0.0 standard drinks of alcohol    Comment: 1 glass of wine per day   Drug use: No   Sexual activity: Not Currently  Other Topics Concern   Not on file  Social History Narrative   Not on file   Social Drivers of Health   Financial Resource Strain: Not on file  Food Insecurity: Not on file  Transportation Needs: Not on file  Physical Activity: Not on file  Stress: Not on file  Social Connections: Not on file  Intimate Partner Violence: Not on file   PHYSICAL EXAM  Vitals:   05/31/23 1454  BP: (!) 142/64  Pulse: 68  Resp: 15  Weight: 111 lb (50.3 kg)  Height: 5\' 4"  (1.626 m)   Body mass index is 19.05 kg/m.  Generalized: Well developed, in no acute distress   Neurological examination  Mentation: Alert oriented to time, place, history taking. Follows all commands speech and language fluent Cranial nerve II-XII: Pupils were equal  round reactive to light. Extraocular movements were full, visual field were full on confrontational test. Facial sensation and strength were normal. Head turning and shoulder shrug  were normal and symmetric. Motor: The motor testing reveals 5 over 5 strength of all 4 extremities. Good symmetric motor tone is noted throughout.  Sensory: Sensory testing is intact to soft touch on all 4 extremities. No evidence of extinction is noted.  Coordination: Cerebellar testing reveals good finger-nose-finger and heel-to-shin bilaterally.  Gait and station: Gait is normal. Reflexes: Deep tendon reflexes are symmetric and normal bilaterally.   DIAGNOSTIC DATA (LABS, IMAGING, TESTING) - I reviewed patient records, labs, notes, testing and imaging myself where available.  Lab Results  Component Value Date   WBC 6.0 10/28/2021   HGB 10.8 (L) 10/28/2021   HCT 33.5 (L) 10/28/2021   MCV 88 10/28/2021   PLT 265 10/28/2021      Component Value Date/Time   NA 144 10/28/2021 0910   K 3.8 10/28/2021 0910   CL 106 10/28/2021 0910   CO2 22 10/28/2021 0910   GLUCOSE 92 10/28/2021 0910   GLUCOSE 90 07/25/2018 0841   BUN 9 10/28/2021 0910   CREATININE 1.04 (H) 10/28/2021 0910   CALCIUM 10.5 (H) 10/28/2021 0910   PROT 7.6 10/28/2021 0910   ALBUMIN 4.8 10/28/2021 0910   AST 20 10/28/2021 0910   ALT 17 10/28/2021 0910   ALKPHOS 81 10/28/2021 0910   BILITOT 0.5 10/28/2021 0910   GFRNONAA 68 08/21/2018 1030  GFRAA 79 08/21/2018 1030   No results found for: "CHOL", "HDL", "LDLCALC", "LDLDIRECT", "TRIG", "CHOLHDL" No results found for: "HGBA1C" No results found for: "VITAMINB12" Lab Results  Component Value Date   TSH 0.780 10/28/2021   Jeanmarie Millet, AGNP-C, DNP 05/31/2023, 3:21 PM [facility] [address] [place], [place] [address] [phone]

## 2023-06-02 DIAGNOSIS — E039 Hypothyroidism, unspecified: Secondary | ICD-10-CM | POA: Diagnosis not present

## 2023-06-02 DIAGNOSIS — D649 Anemia, unspecified: Secondary | ICD-10-CM | POA: Diagnosis not present

## 2023-06-02 DIAGNOSIS — I10 Essential (primary) hypertension: Secondary | ICD-10-CM | POA: Diagnosis not present

## 2023-06-02 DIAGNOSIS — N182 Chronic kidney disease, stage 2 (mild): Secondary | ICD-10-CM | POA: Diagnosis not present

## 2023-06-02 DIAGNOSIS — E46 Unspecified protein-calorie malnutrition: Secondary | ICD-10-CM | POA: Diagnosis not present

## 2023-06-02 DIAGNOSIS — E042 Nontoxic multinodular goiter: Secondary | ICD-10-CM | POA: Diagnosis not present

## 2023-06-02 DIAGNOSIS — Z0000 Encounter for general adult medical examination without abnormal findings: Secondary | ICD-10-CM | POA: Diagnosis not present

## 2023-06-02 DIAGNOSIS — R809 Proteinuria, unspecified: Secondary | ICD-10-CM | POA: Diagnosis not present

## 2023-06-02 DIAGNOSIS — Z8673 Personal history of transient ischemic attack (TIA), and cerebral infarction without residual deficits: Secondary | ICD-10-CM | POA: Diagnosis not present

## 2023-06-06 ENCOUNTER — Telehealth: Payer: Self-pay | Admitting: Neurology

## 2023-06-06 NOTE — Telephone Encounter (Signed)
 Pt said she was called about an EEG, she agreed, but now wants to discuss further the reason for the EEG, please call.

## 2023-06-06 NOTE — Addendum Note (Signed)
 Addended by: Wess Hammed on: 06/06/2023 07:59 AM   Modules accepted: Orders

## 2023-06-06 NOTE — Telephone Encounter (Signed)
 1st attempt, unable to leave msg

## 2023-06-07 NOTE — Telephone Encounter (Signed)
 Returned call to pt who stated that she got a call from sarah asking her to do eeg and she wanted to have that explained better to her. I explained that its used to count amount of seizures that occur during a time frame of the test. Pt that she would proceed with eeg. Advised pt that someone will call to scheduled eeg and pt voiced understanding

## 2023-06-15 ENCOUNTER — Ambulatory Visit: Payer: Self-pay | Admitting: Neurology

## 2023-06-15 ENCOUNTER — Ambulatory Visit (INDEPENDENT_AMBULATORY_CARE_PROVIDER_SITE_OTHER): Admitting: *Deleted

## 2023-06-15 DIAGNOSIS — G40909 Epilepsy, unspecified, not intractable, without status epilepticus: Secondary | ICD-10-CM

## 2023-06-15 NOTE — Telephone Encounter (Signed)
 Please call, EEG was normal. Check on how she is doing on the medication Keppra  and Zonegran  and how she is taking them? Did she have labs done at her primary care to check her levels? Thanks

## 2023-06-15 NOTE — Telephone Encounter (Signed)
 Pt has returned call to [facility]

## 2023-06-15 NOTE — Procedures (Signed)
    History:  78 year old woman with seizure disorder  EEG classification: Awake and drowsy  Duration: 27 minutes   Technical aspects: This EEG study was done with scalp electrodes positioned according to the 10-20 International system of electrode placement. Electrical activity was reviewed with band pass filter of 1-70Hz , sensitivity of 7 uV/mm, display speed of 78mm/sec with a 60Hz  notched filter applied as appropriate. EEG data were recorded continuously and digitally stored.   Description of the recording: The background rhythms of this recording consists of a fairly well modulated medium amplitude alpha rhythm of 9 Hz that is reactive to eye opening and closure. Present in the anterior head region is a 15-20 Hz beta activity. Photic stimulation was performed, did not show any abnormalities. Hyperventilation was also performed, did not show any abnormalities. Drowsiness was manifested by background fragmentation. No abnormal epileptiform discharges seen during this recording. There was no focal slowing. There were no electrographic seizure identified.   Abnormality: None   Impression: This is a normal awake and drowsy EEG. No evidence of interictal epileptiform discharges. Normal EEGs, however, do not rule out epilepsy.    Park Beck, MD [facility]

## 2023-12-04 ENCOUNTER — Ambulatory Visit: Admitting: Neurology

## 2023-12-21 ENCOUNTER — Ambulatory Visit: Admitting: Neurology

## 2024-05-30 ENCOUNTER — Ambulatory Visit: Admitting: Neurology
